# Patient Record
Sex: Male | Born: 1965 | Race: White | Hispanic: No | Marital: Married | Smoking: Never smoker
Health system: Southern US, Community
[De-identification: ages and names within clinical notes are randomized; demographics above are authoritative.]

## PROBLEM LIST (undated history)

## (undated) DIAGNOSIS — G4730 Sleep apnea, unspecified: Secondary | ICD-10-CM

## (undated) DIAGNOSIS — F419 Anxiety disorder, unspecified: Secondary | ICD-10-CM

## (undated) DIAGNOSIS — E782 Mixed hyperlipidemia: Secondary | ICD-10-CM

## (undated) DIAGNOSIS — Z860100 Personal history of colon polyps, unspecified: Secondary | ICD-10-CM

## (undated) DIAGNOSIS — J45909 Unspecified asthma, uncomplicated: Secondary | ICD-10-CM

## (undated) DIAGNOSIS — K219 Gastro-esophageal reflux disease without esophagitis: Secondary | ICD-10-CM

## (undated) DIAGNOSIS — E079 Disorder of thyroid, unspecified: Secondary | ICD-10-CM

## (undated) DIAGNOSIS — E7800 Pure hypercholesterolemia, unspecified: Secondary | ICD-10-CM

## (undated) DIAGNOSIS — K5792 Diverticulitis of intestine, part unspecified, without perforation or abscess without bleeding: Secondary | ICD-10-CM

## (undated) HISTORY — PX: COLECTOMY: SHX59

## (undated) HISTORY — DX: Anxiety disorder, unspecified: F41.9

## (undated) HISTORY — PX: KNEE SURGERY: SHX244

## (undated) HISTORY — DX: Personal history of colonic polyps: Z86.010

## (undated) HISTORY — DX: Mixed hyperlipidemia: E78.2

## (undated) HISTORY — DX: Unspecified asthma, uncomplicated: J45.909

## (undated) HISTORY — DX: Gastro-esophageal reflux disease without esophagitis: K21.9

## (undated) HISTORY — DX: Personal history of colon polyps, unspecified: Z86.0100

## (undated) HISTORY — DX: Sleep apnea, unspecified: G47.30

## (undated) HISTORY — DX: Disorder of thyroid, unspecified: E07.9

## (undated) HISTORY — DX: Diverticulitis of intestine, part unspecified, without perforation or abscess without bleeding: K57.92

---

## 2001-01-12 ENCOUNTER — Emergency Department (HOSPITAL_COMMUNITY): Admission: EM | Admit: 2001-01-12 | Discharge: 2001-01-12 | Payer: Self-pay | Admitting: Emergency Medicine

## 2004-09-03 ENCOUNTER — Encounter: Admission: RE | Admit: 2004-09-03 | Discharge: 2004-09-03 | Payer: Self-pay | Admitting: *Deleted

## 2004-09-15 ENCOUNTER — Encounter (INDEPENDENT_AMBULATORY_CARE_PROVIDER_SITE_OTHER): Payer: Self-pay | Admitting: Specialist

## 2004-09-15 ENCOUNTER — Ambulatory Visit (HOSPITAL_COMMUNITY): Admission: RE | Admit: 2004-09-15 | Discharge: 2004-09-15 | Payer: Self-pay | Admitting: *Deleted

## 2004-12-06 ENCOUNTER — Emergency Department (HOSPITAL_COMMUNITY): Admission: EM | Admit: 2004-12-06 | Discharge: 2004-12-06 | Payer: Self-pay | Admitting: Family Medicine

## 2006-10-19 ENCOUNTER — Encounter: Admission: RE | Admit: 2006-10-19 | Discharge: 2006-10-19 | Payer: Self-pay | Admitting: Gastroenterology

## 2007-02-08 ENCOUNTER — Inpatient Hospital Stay (HOSPITAL_COMMUNITY): Admission: AD | Admit: 2007-02-08 | Discharge: 2007-02-10 | Payer: Self-pay | Admitting: Gastroenterology

## 2007-02-08 ENCOUNTER — Encounter: Admission: RE | Admit: 2007-02-08 | Discharge: 2007-02-08 | Payer: Self-pay | Admitting: Gastroenterology

## 2007-02-27 ENCOUNTER — Encounter: Admission: RE | Admit: 2007-02-27 | Discharge: 2007-02-27 | Payer: Self-pay | Admitting: Gastroenterology

## 2007-04-04 ENCOUNTER — Encounter: Admission: RE | Admit: 2007-04-04 | Discharge: 2007-04-04 | Payer: Self-pay | Admitting: Surgery

## 2007-05-31 ENCOUNTER — Encounter (INDEPENDENT_AMBULATORY_CARE_PROVIDER_SITE_OTHER): Payer: Self-pay | Admitting: Surgery

## 2007-05-31 ENCOUNTER — Inpatient Hospital Stay (HOSPITAL_COMMUNITY): Admission: RE | Admit: 2007-05-31 | Discharge: 2007-06-04 | Payer: Self-pay | Admitting: Surgery

## 2010-05-31 ENCOUNTER — Encounter: Payer: Self-pay | Admitting: Gastroenterology

## 2010-09-22 NOTE — H&P (Signed)
NAME:  Elijah Small, Elijah Small          ACCOUNT NO.:  [id number]   MEDICAL RECORD NO.:  [id number]          PATIENT TYPE:  INP   LOCATION:  5707                         FACILITY:  [facility]   PHYSICIAN:  John C. Madilyn Fireman, M.D.    DATE OF BIRTH:  1965/12/23   DATE OF ADMISSION:  02/08/2007  DATE OF DISCHARGE:                              HISTORY & PHYSICAL   [name] a 45 year old male with a history of recent  diverticulitis in June 2008.  He presents today with recurrent symptoms  of suprapubic pain that spreads to his left lower quadrant.  The patient  had one episode of bright red blood per rectum last week but none since.  He has had no other changes in his bowel habits.  Denies any melena,  vomiting, nausea or anorexia as well as any recent fever or illness.  The patient reports that both his father and his brother have had  diverticulosis.  His brother had part of his colon removed.  The patient  does report having occasional indigestion for which he takes Prilosec.   PRIMARY CARE PHYSICIAN:  Dr. Wynelle Link with [facility].   PHYSICIANS:  Gastroenterologist is Dr. Wandalee Ferdinand.   PAST MEDICAL HISTORY:  1. Sleep apnea.  2. History of colon polyps.  3. History of vasectomy.  His last colonoscopy was done in May 2006.      Findings were rare diverticulum of sigmoid colon, possibly internal      hemorrhoids.  Biopsy was benign.  Dr. Virginia Rochester was the endoscopist.  He      notes to consider doing the patient under general anesthesia or      propofol with his next colonoscopy.  His last upper endoscopy was      done by Dr. Virginia Rochester on the same date May 2006.  Findings included      erythematous changes of the stomach, distal esophagus; both of      which were biopsied and were benign.  He was H. pylori negative,      and he had a widely patent gastroesophageal junction.   CURRENT MEDICATIONS:  1. Prilosec.  2. He reports no excessive NSAID use.   ALLERGIES:  He has no known drug  allergies.   FAMILY HISTORY:  Negative for colon cancer, liver disease.   SOCIAL HISTORY:  The patient is an Art gallery manager.  He is married.  He reports  no alcohol, tobacco or drug use.   PHYSICAL EXAMINATION:  GENERAL:  He is alert, oriented, in no apparent  distress.  VITAL SIGNS:  Current temperature is 100, pulse 103, blood pressure  124/78.  HEART:  Has a regular rate and rhythm with no murmurs, rubs or gallops  appreciated.  LUNGS:  Clear to auscultation bilaterally with no wheezes or crackles.  ABDOMEN:  Soft, tender in the left lower quadrant and suprapubic areas.  He has good bowel sounds.   LABORATORY DATA:  Labs are pending.  CT of his abdomen and pelvis done  today February 08, 2007 at [facility] showed acute sigmoid  diverticulitis with focal perforation.   ASSESSMENT:  Dr. Jonny Ruiz  Madilyn Fireman has seen and examined the patient, collected  a history, and reviewed the patient's record.  He states this is a 89-  year-old generally healthy male who has had a previous bout of  diverticulitis in June.  Presents today with focal perforation on CT  scan.  Will admit, gently rehydrate, give IV Cipro and Flagyl, and  monitor closely.  Will do colonoscopy at the appropriate time.      Stephani Police, PA    ______________________________  Everardo All Madilyn Fireman, M.D.    MLY/MEDQ  D:  02/08/2007  T:  02/09/2007  Job:  [id number]   cc:   Graylin Shiver, M.D.  Deatra James, M.D.

## 2010-09-22 NOTE — Op Note (Signed)
NAME:  Elijah Small, Elijah Small          ACCOUNT NO.:  [id number]   MEDICAL RECORD NO.:  [id number]          PATIENT TYPE:  INP   LOCATION:  0011                         FACILITY:  [facility]   PHYSICIAN:  Currie Paris, M.D.DATE OF BIRTH:  06/15/1965   DATE OF PROCEDURE:  05/31/2007  DATE OF DISCHARGE:                               OPERATIVE REPORT   PREOPERATIVE DIAGNOSIS:  Sigmoid diverticulitis, diverticulosis and  recent episodes of diverticulitis.   POSTOPERATIVE DIAGNOSIS:  Sigmoid diverticulosis with subacute sigmoid  diverticulitis.   OPERATION PERFORMED:  Laparoscopic assisted sigmoid colectomy.   SURGEON:  Cyndia Bent, M.D.   ASSISTANT:  Bertram Savin, M.D.   ANESTHESIA:  General endotracheal.   CLINICAL HISTORY:  Elijah Small is a 45 year old gentleman who has had  multiple episodes of diverticulitis, one of which at least had shown a  little localized microperforation in the sigmoid colon.  A very minimum  it has shown fairly extensive diverticulosis, but a lot of spasm in the  area that had the perforation was muscular thickening.  After a lengthy  discussion with the patient, he elected to proceed to sigmoid colectomy.   DESCRIPTION OF PROCEDURE:  The patient was seen in the holding area and  he had no further questions.  We confirmed sigmoid colectomy as the  planned procedure.   The patient was taken to the operating room and after satisfactory  general endotracheal anesthesia had been obtained, a Foley catheter was  placed and the abdomen clipped, prepped and draped.  The time-out was  done.   I made a short supraumbilical incision, opened the fascia, put in a  pursestring and introduced the Hasson cannula.  With the camera place, I  could see there was an area of inflammatory changes in the left lower  quadrant consistent with his prior diverticulitis.  Two 5 mm ports were  placed; one in the right lower quadrant and one in the suprapubic  midline, both  under direct vision.  With those in place, I was able to  mobilize a little bit of the colon and see that he had a chronic abscess  cavity from the sigmoid to almost the anterior abdominal wall well away  from the retroperitoneal structures and ureter.  Except for that, the  colon appeared quite mobile.   I put a third 5 mm port in the left upper quadrant and using a  combination of scissors, sharp and blunt dissection and the Ligasure  device, I was able to mobilize up the white line of Toldt to the splenic  flexure to make sure we had adequate mobilization of the descending  colon, but we really did not go up and take the splenic flexure down.   Attention was turned back and we really had a very floppy sigmoid and  the area of the perforation appeared to be in the mid sigmoid and once  it had been bluntly freed up from the abdominal wall was quite mobile.  I reflected that up a little bit, but thought that we could really do a  limited sigmoid resection.  He has known diverticular disease, but I  thought that it would be much more major resection to get all of the  small tics out and clearly his symptomatology was related to the small  perforation that had been apparently chronic.   Once I had all of this freed up, I went ahead and made a short  suprapubic incision and entered the peritoneal cavity.  The GelPort  protector was placed.  Initially I had planned to use my hand to dissect  this up further, but once this was in and we got visualization I saw  that the area of the perforation came right up into the wound with good  exposure distally to the rectosigmoid where there were no further tics  and good exposure proximally where we had already mobilized the colon,  so this all was into the wound nicely.   I thought that we could do a resection of this area and do a primary  anastomosis directly through the port.   I divided the distal colon right at the rectosigmoid junction and  then  divided the mesentery staying very close to the colon.  As noted, we  were well up onto the sigmoid so I was well away from the area of the  ureter.  I did this until I was in an area proximal that appeared to be  free of tics and any inflammatory process or any thickening.  The bowel  was divided there.  The specimens was handed off.  I did obtain a  culture of the area where the perforation was, so I think he had a small  chronic abscess cavity.   Stay sutures were placed on the corners of the bowel and the bowel  clamps removed.  An end-to-end sutured anastomosis single layer of 3-0  silk was performed producing a tension-free anastomosis that appeared to  be intact and quite viable with no evidence of any ischemia or other  issues.  As noted there was no tension.   I irrigated as we closed.  I palpated the anastomosis to be sure it was  widely patent.  The colon was dropped back in.  The GelPort was removed  and instruments and gloves changed.   The fascia was closed with a #1 looped PDS.  The abdomen was  reinsufflated through the supraumbilical port and using the camera made  sure there had been no evidence of bleeding or collection of blood or  any other problems that had occurred while we had the colon out and were  doing the resection.  Everything looked fine.  Those two ports were  removed.  The right lower quadrant port had also been removed.  The  abdomen was deflated.  Skin was closed with staples.   The patient tolerated the procedure well and there were no  complications.  Estimated blood loss was approximately 100 mL.  All  counts were correct.      Currie Paris, M.D.  Electronically Signed     CJS/MEDQ  D:  05/31/2007  T:  05/31/2007  Job:  [id number]   cc:   Deatra James, M.D.  Fax: [phone]   Graylin Shiver, M.D.  Fax: [phone]

## 2010-09-25 NOTE — Op Note (Signed)
NAME:  KEO, SCHIRMER          ACCOUNT NO.:  [id number]   MEDICAL RECORD NO.:  [id number]          PATIENT TYPE:  AMB   LOCATION:  ENDO                         FACILITY:  [facility]   PHYSICIAN:  Georgiana Spinner, M.D.    DATE OF BIRTH:  Oct 16, 1965   DATE OF PROCEDURE:  DATE OF DISCHARGE:                                 OPERATIVE REPORT   PROCEDURE:  Upper endoscopy with biopsy.   INDICATIONS:  Abdominal pain.   ANESTHESIA:  Demerol 75 mg, Versed 8 mg.   DESCRIPTION OF PROCEDURE:  With the patient mildly sedated in the left  lateral decubitus position, the Olympus videoscopic endoscope was inserted  in the mouth and passed under direct vision through the esophagus which  appeared mildly inflamed distally which was photographed and biopsied.  We  entered into the stomach and the fundus, body, antrum, duodenal bulb, and  second portion of the duodenum were visualized. From this point, the  endoscope was slowly withdrawn, taking circumferential views of the colonic  mucosa until the endoscope was then pulled back into the stomach and placed  in retroflexion, viewing the stomach from below and an incomplete wrap of  the GE junction was visualized and photographed.  The endoscope was then  straightened and withdrawn, taking circumferential views of the remaining  gastric and esophageal mucosa, stopping at the fundus where erythematous  changes were seen, photographed and biopsied.  The patient's vital signs and  pulse oximetry remained stable and the patient tolerated the procedure well  without apparent complications.   FINDINGS:  1.  Erythematous changes of stomach, biopsied.  2.  Erythematous changes of the distal esophagus, biopsied.  3.  Widely patent gastroesophageal junction, photographed.   PLAN:  Await biopsy report.  The patient will call me for results and follow  up with me as an outpatient and proceed to colonoscopy.      GMO/MEDQ  D:  09/15/2004  T:  09/15/2004  Job:   [id number]

## 2010-09-25 NOTE — Discharge Summary (Signed)
NAME:  Elijah Small, Elijah Small          ACCOUNT NO.:  [id number]   MEDICAL RECORD NO.:  [id number]          PATIENT TYPE:  INP   LOCATION:  1526                         [facility]ACILITY:  [facility]   PHYSICIAN:  Currie Paris, M.D.DATE OF BIRTH:  07-12-1965   DATE OF ADMISSION:  05/31/2007  DATE OF DISCHARGE:  06/04/2007                               DISCHARGE SUMMARY   FINAL DIAGNOSES:  1. 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  2. History of sleep apnea.   CLINICAL HISTORY:  [name] is a 45 year old gentleman who has  been treated on several occasions for somewhat recurrent persistent  diverticulitis.  He was admitted for elective sigmoid colectomy.   HOSPITAL COURSE:  The patient was taken to the operating room where on  January 21 and a laparoscopic-assisted sigmoid colectomy with primary  anastomosis was performed.  Operative finding was that of a chronic  small abscess cavity, fairly anteriorly where he had a tiny perforation  of the sigmoid which I think had been walled off but still responsible  for his intermittent episodes.   The patient tolerated the surgery well.  Postoperatively, he had a  fairly benign postoperative course, and by the fourth postop day was  tolerating diet, ambulating with minimal pain and able to be discharged.  He was discharged to be followed in my office in approximately 1 week.  Pathology report confirmed diverticulitis with rupture.   The patient was to be followed up in my office for suture removal in  approximately week to 10 days.  His activities are limited.  He will  also arrange long-term followup with his primary physicians.      Currie Paris, M.D.  Electronically Signed     CJS/MEDQ  D:  06/13/2007  T:  06/13/2007  Job:  [id number]   cc:   Deatra James, M.D.  Fax: [phone]   Graylin Shiver, M.D.  Fax: [phone]

## 2010-09-25 NOTE — Op Note (Signed)
NAME:  Elijah Small, Elijah Small          ACCOUNT NO.:  [id number]   MEDICAL RECORD NO.:  [id number]          PATIENT TYPE:  AMB   LOCATION:  ENDO                         FACILITY:  [facility]   PHYSICIAN:  Georgiana Spinner, M.D.    DATE OF BIRTH:  03-21-1966   DATE OF PROCEDURE:  DATE OF DISCHARGE:                                 OPERATIVE REPORT   PROCEDURE:  Colonoscopy.   INDICATIONS:  Colon polyp.   ANESTHESIA:  Demerol was not given, just Versed 2 mg.   PROCEDURE:  With the patient mildly sedated in the left lateral decubitus  position, a rectal examination was performed. Prostate was normal.  Subsequently, the Olympus videoscopic colonoscope was inserted in the rectum  and passed under direct vision to the cecum, identified by base of cecum,  ileocecal valve and appendiceal orifice both which were photographed.  From  this point, the colonoscope was slowly withdrawn taking circumferential  views of the colonic mucosa stopping at 40 cm from the anal verge at which  point we encountered a few diverticula and two polyps, one of which was  removed using hot biopsy forceps technique, the other with snare cautery  technique setting  with the same setting of 20/200 blended current, both  were retrieved for pathology. The endoscope was then withdrawn all the way  to the rectum which appeared normal on direct and retroflexed view.  The  endoscope was straightened and withdrawn. The patient's vital signs, pulse  oximeter remained stable. The patient tolerated procedure well without  apparent complications.   FINDINGS:  Rare diverticulum of sigmoid colon, possibly internal hemorrhoids  not well visualized,  polyps as described above.   PLAN:  Await biopsy report. The patient will call me for results. Follow-up  with me as an outpatient.  Consider doing him with general anesthesia or  Propofol on his next colonoscopy.      GMO/MEDQ  D:  09/15/2004  T:  09/15/2004  Job:  [id number]

## 2011-01-28 LAB — DIFFERENTIAL
Basophils Absolute: 0
Basophils Relative: 0
Neutrophils Relative %: 63

## 2011-01-28 LAB — COMPREHENSIVE METABOLIC PANEL
ALT: 57 — ABNORMAL HIGH
Albumin: 4
Alkaline Phosphatase: 45
CO2: 28
Calcium: 9.8
GFR calc Af Amer: >60
Total Bilirubin: 0.9
Total Protein: 6.9

## 2011-01-28 LAB — CBC
HCT: 44.7
Hemoglobin: 13.5
Hemoglobin: 15.7
MCHC: 35.3
MCV: 82.8
MCV: 83.9
Platelets: 160
Platelets: 168
RBC: 5.33
RDW: 12.4
WBC: 12.3 — ABNORMAL HIGH
WBC: 6.2

## 2011-01-28 LAB — URINALYSIS, ROUTINE W REFLEX MICROSCOPIC
Specific Gravity, Urine: 1.024
Urobilinogen, UA: 0.2

## 2011-01-28 LAB — WOUND CULTURE

## 2011-01-28 LAB — ANAEROBIC CULTURE: Gram Stain: NONE SEEN

## 2011-02-18 LAB — COMPREHENSIVE METABOLIC PANEL
AST: 27
AST: 28
Alkaline Phosphatase: 48
BUN: 7
CO2: 27
CO2: 29
Calcium: 9.2
Chloride: 105
Creatinine, Ser: 1.19
GFR calc Af Amer: >60
GFR calc Af Amer: >60
GFR calc non Af Amer: >60
Glucose, Bld: 115 — ABNORMAL HIGH
Potassium: 3.7
Sodium: 132 — ABNORMAL LOW
Total Protein: 6.5
Total Protein: 7.4

## 2011-02-18 LAB — BASIC METABOLIC PANEL
BUN: 7
Chloride: 103
Creatinine, Ser: 1.12
Glucose, Bld: 78
Potassium: 3.5

## 2011-02-18 LAB — HEPATIC FUNCTION PANEL
ALT: 66 — ABNORMAL HIGH
AST: 35
Bilirubin, Direct: 0.3
Indirect Bilirubin: 1.3 — ABNORMAL HIGH
Total Bilirubin: 1.6 — ABNORMAL HIGH

## 2011-02-18 LAB — CBC
HCT: 37.5 — ABNORMAL LOW
MCHC: 34.2
MCHC: 35.1
MCV: 84.8
RBC: 5.31
WBC: 10

## 2012-07-07 ENCOUNTER — Encounter: Payer: Self-pay | Admitting: Family Medicine

## 2012-07-07 DIAGNOSIS — K5792 Diverticulitis of intestine, part unspecified, without perforation or abscess without bleeding: Secondary | ICD-10-CM | POA: Insufficient documentation

## 2012-07-07 DIAGNOSIS — F419 Anxiety disorder, unspecified: Secondary | ICD-10-CM | POA: Insufficient documentation

## 2012-07-07 DIAGNOSIS — G4733 Obstructive sleep apnea (adult) (pediatric): Secondary | ICD-10-CM | POA: Insufficient documentation

## 2012-07-07 DIAGNOSIS — Z86010 Personal history of colonic polyps: Secondary | ICD-10-CM | POA: Insufficient documentation

## 2012-08-08 ENCOUNTER — Ambulatory Visit: Payer: Self-pay | Admitting: Family Medicine

## 2012-08-08 DIAGNOSIS — Z0289 Encounter for other administrative examinations: Secondary | ICD-10-CM

## 2012-11-30 ENCOUNTER — Telehealth: Payer: Self-pay | Admitting: *Deleted

## 2012-11-30 MED ORDER — SERTRALINE HCL 100 MG PO TABS: 100.0000 mg | ORAL_TABLET | Freq: Every day | ORAL | Status: DC

## 2012-11-30 NOTE — Telephone Encounter (Signed)
Please refill Zoloft for 30 days.  He has an up coming appt for labs and an office visit scheduled.  He uses [facility] on [address] in [place].

## 2012-11-30 NOTE — Addendum Note (Signed)
Addended by: Clint Bolder D on: 11/30/2012 03:44 PM   Modules accepted: Orders

## 2012-12-06 ENCOUNTER — Other Ambulatory Visit: Payer: Self-pay | Admitting: *Deleted

## 2012-12-06 DIAGNOSIS — R5381 Other malaise: Secondary | ICD-10-CM

## 2012-12-06 DIAGNOSIS — E785 Hyperlipidemia, unspecified: Secondary | ICD-10-CM

## 2012-12-06 DIAGNOSIS — E559 Vitamin D deficiency, unspecified: Secondary | ICD-10-CM

## 2012-12-06 DIAGNOSIS — R5383 Other fatigue: Secondary | ICD-10-CM

## 2012-12-07 ENCOUNTER — Other Ambulatory Visit: Payer: Self-pay

## 2012-12-07 LAB — COMPLETE METABOLIC PANEL WITH GFR
ALT: 26 U/L (ref 0–53)
AST: 17 U/L (ref 0–37)
Albumin: 4.4 g/dL (ref 3.5–5.2)
Alkaline Phosphatase: 49 U/L (ref 39–117)
BUN: 13 mg/dL (ref 6–23)
CO2: 26 mEq/L (ref 19–32)
Calcium: 9.2 mg/dL (ref 8.4–10.5)
Chloride: 105 mEq/L (ref 96–112)
Creat: 1.15 mg/dL (ref 0.50–1.35)
GFR, Est African American: 87 mL/min
GFR, Est Non African American: 75 mL/min
Glucose, Bld: 97 mg/dL (ref 70–99)
Potassium: 5 mEq/L (ref 3.5–5.3)
Sodium: 139 mEq/L (ref 135–145)
Total Bilirubin: 0.6 mg/dL (ref 0.3–1.2)
Total Protein: 6.8 g/dL (ref 6.0–8.3)

## 2012-12-07 LAB — CBC WITH DIFFERENTIAL/PLATELET
Basophils Absolute: 0 10*3/uL (ref 0.0–0.1)
Basophils Relative: 0 % (ref 0–1)
Eosinophils Absolute: 0.2 10*3/uL (ref 0.0–0.7)
Eosinophils Relative: 3 % (ref 0–5)
HCT: 40.7 % (ref 39.0–52.0)
Hemoglobin: 14.3 g/dL (ref 13.0–17.0)
Lymphocytes Relative: 31 % (ref 12–46)
Lymphs Abs: 1.8 10*3/uL (ref 0.7–4.0)
MCH: 29.1 pg (ref 26.0–34.0)
MCHC: 35.1 g/dL (ref 30.0–36.0)
MCV: 82.9 fL (ref 78.0–100.0)
Monocytes Absolute: 0.5 10*3/uL (ref 0.1–1.0)
Monocytes Relative: 9 % (ref 3–12)
Neutro Abs: 3.3 10*3/uL (ref 1.7–7.7)
Neutrophils Relative %: 57 % (ref 43–77)
Platelets: 132 10*3/uL — ABNORMAL LOW (ref 150–400)
RBC: 4.91 MIL/uL (ref 4.22–5.81)
RDW: 13.5 % (ref 11.5–15.5)
WBC: 5.8 10*3/uL (ref 4.0–10.5)

## 2012-12-07 LAB — LIPID PANEL
Cholesterol: 199 mg/dL (ref 0–200)
HDL: 33 mg/dL — ABNORMAL LOW (ref >39)
LDL Cholesterol: 144 mg/dL — ABNORMAL HIGH (ref 0–99)
Total CHOL/HDL Ratio: 6 Ratio
Triglycerides: 108 mg/dL (ref <150)
VLDL: 22 mg/dL (ref 0–40)

## 2012-12-07 LAB — TSH: TSH: 4.349 u[IU]/mL (ref 0.350–4.500)

## 2012-12-08 LAB — VITAMIN D 25 HYDROXY (VIT D DEFICIENCY, FRACTURES): Vit D, 25-Hydroxy: 38 ng/mL (ref 30–89)

## 2012-12-21 ENCOUNTER — Ambulatory Visit (INDEPENDENT_AMBULATORY_CARE_PROVIDER_SITE_OTHER): Payer: 59 | Admitting: Family Medicine

## 2012-12-21 ENCOUNTER — Encounter: Payer: Self-pay | Admitting: Family Medicine

## 2012-12-21 VITALS — BP 108/70 | HR 79 | Resp 16 | Ht 69.0 in | Wt 254.0 lb

## 2012-12-21 DIAGNOSIS — F411 Generalized anxiety disorder: Secondary | ICD-10-CM

## 2012-12-21 DIAGNOSIS — E785 Hyperlipidemia, unspecified: Secondary | ICD-10-CM

## 2012-12-21 MED ORDER — SERTRALINE HCL 100 MG PO TABS: 100.0000 mg | ORAL_TABLET | Freq: Every day | ORAL | Status: DC

## 2012-12-21 MED ORDER — PRAVASTATIN SODIUM 40 MG PO TABS: 40.0000 mg | ORAL_TABLET | Freq: Every day | ORAL | Status: DC

## 2012-12-21 NOTE — Patient Instructions (Addendum)
1)  Mood - Stay on the Zoloft.   2)  Cholesterol - Take pravastatin 40 mg per day.  We'll recheck your level in 6 months.     Fat and Cholesterol Control Diet Cholesterol levels in your body are determined significantly by your diet. Cholesterol levels may also be related to heart disease. The following material helps to explain this relationship and discusses what you can do to help keep your heart healthy. Not all cholesterol is bad. Low-density lipoprotein (LDL) cholesterol is the "bad" cholesterol. It may cause fatty deposits to build up inside your arteries. High-density lipoprotein (HDL) cholesterol is "good." It helps to remove the "bad" LDL cholesterol from your blood. Cholesterol is a very important risk factor for heart disease. Other risk factors are high blood pressure, smoking, stress, heredity, and weight. The heart muscle gets its supply of blood through the coronary arteries. If your LDL cholesterol is high and your HDL cholesterol is low, you are at risk for having fatty deposits build up in your coronary arteries. This leaves less room through which blood can flow. Without sufficient blood and oxygen, the heart muscle cannot function properly and you may feel chest pains (angina pectoris). When a coronary artery closes up entirely, a part of the heart muscle may die causing a heart attack (myocardial infarction). CHECKING CHOLESTEROL When your caregiver sends your blood to a lab to be examined for cholesterol, a complete lipid (fat) profile may be done. With this test, the total amount of cholesterol and levels of LDL and HDL are determined. Triglycerides are a type of fat that circulates in the blood. They can also be used to determine heart disease risk. The list below describes what the numbers should be: Test: Total Cholesterol.  Less than 200 mg/dl. Test: LDL "bad cholesterol."  Less than 100 mg/dl.  Less than 70 mg/dl if you are at very high risk of a heart attack or sudden  cardiac death. Test: HDL "good cholesterol."  Greater than 50 mg/dl for women.  Greater than 40 mg/dl for men. Test: Triglycerides.  Less than 150 mg/dl. CONTROLLING CHOLESTEROL WITH DIET Although exercise and lifestyle factors are important, your diet is key. That is because certain foods are known to raise cholesterol and others to lower it. The goal is to balance foods for their effect on cholesterol and more importantly, to replace saturated and trans fat with other types of fat, such as monounsaturated fat, polyunsaturated fat, and omega-3 fatty acids. On average, a person should consume no more than 15 to 17 g of saturated fat daily. Saturated and trans fats are considered "bad" fats, and they will raise LDL cholesterol. Saturated fats are primarily found in animal products such as meats, butter, and cream. However, that does not mean you need to give up all your favorite foods. Today, there are good tasting, low-fat, low-cholesterol substitutes for most of the things you like to eat. Choose low-fat or nonfat alternatives. Choose round or loin cuts of red meat. These types of cuts are lowest in fat and cholesterol. Chicken (without the skin), fish, veal, and ground [place] breast are great choices. Eliminate fatty meats, such as hot dogs and salami. Even shellfish have little or no saturated fat. Have a 3 oz (85 g) portion when you eat lean meat, poultry, or fish. Trans fats are also called "partially hydrogenated oils." They are oils that have been scientifically manipulated so that they are solid at room temperature resulting in a longer shelf life and  improved taste and texture of foods in which they are added. Trans fats are found in stick margarine, some tub margarines, cookies, crackers, and baked goods.  When baking and cooking, oils are a great substitute for butter. The monounsaturated oils are especially beneficial since it is believed they lower LDL and raise HDL. The oils you should  avoid entirely are saturated tropical oils, such as coconut and palm.  Remember to eat a lot from food groups that are naturally free of saturated and trans fat, including fish, fruit, vegetables, beans, grains (barley, rice, couscous, bulgur wheat), and pasta (without cream sauces).  IDENTIFYING FOODS THAT LOWER CHOLESTEROL  Soluble fiber may lower your cholesterol. This type of fiber is found in fruits such as apples, vegetables such as broccoli, potatoes, and carrots, legumes such as beans, peas, and lentils, and grains such as barley. Foods fortified with plant sterols (phytosterol) may also lower cholesterol. You should eat at least 2 g per day of these foods for a cholesterol lowering effect.  Read package labels to identify low-saturated fats, trans fat free, and low-fat foods at the supermarket. Select cheeses that have only 2 to 3 g saturated fat per ounce. Use a heart-healthy tub margarine that is free of trans fats or partially hydrogenated oil. When buying baked goods (cookies, crackers), avoid partially hydrogenated oils. Breads and muffins should be made from whole grains (whole-wheat or whole oat flour, instead of "flour" or "enriched flour"). Buy non-creamy canned soups with reduced salt and no added fats.  FOOD PREPARATION TECHNIQUES  Never deep-fry. If you must fry, either stir-fry, which uses very little fat, or use non-stick cooking sprays. When possible, broil, bake, or roast meats, and steam vegetables. Instead of putting butter or margarine on vegetables, use lemon and herbs, applesauce, and cinnamon (for squash and sweet potatoes), nonfat yogurt, salsa, and low-fat dressings for salads.  LOW-SATURATED FAT / LOW-FAT FOOD SUBSTITUTES Meats / Saturated Fat (g)  Avoid: Steak, marbled (3 oz/85 g) / 11 g  Choose: Steak, lean (3 oz/85 g) / 4 g  Avoid: Hamburger (3 oz/85 g) / 7 g  Choose: Hamburger, lean (3 oz/85 g) / 5 g  Avoid: Ham (3 oz/85 g) / 6 g  Choose: Ham, lean cut (3  oz/85 g) / 2.4 g  Avoid: Chicken, with skin, dark meat (3 oz/85 g) / 4 g  Choose: Chicken, skin removed, dark meat (3 oz/85 g) / 2 g  Avoid: Chicken, with skin, light meat (3 oz/85 g) / 2.5 g  Choose: Chicken, skin removed, light meat (3 oz/85 g) / 1 g Dairy / Saturated Fat (g)  Avoid: Whole milk (1 cup) / 5 g  Choose: Low-fat milk, 2% (1 cup) / 3 g  Choose: Low-fat milk, 1% (1 cup) / 1.5 g  Choose: Skim milk (1 cup) / 0.3 g  Avoid: Hard cheese (1 oz/28 g) / 6 g  Choose: Skim milk cheese (1 oz/28 g) / 2 to 3 g  Avoid: Cottage cheese, 4% fat (1 cup) / 6.5 g  Choose: Low-fat cottage cheese, 1% fat (1 cup) / 1.5 g  Avoid: Ice cream (1 cup) / 9 g  Choose: Sherbet (1 cup) / 2.5 g  Choose: Nonfat frozen yogurt (1 cup) / 0.3 g  Choose: Frozen fruit bar / trace  Avoid: Whipped cream (1 tbs) / 3.5 g  Choose: Nondairy whipped topping (1 tbs) / 1 g Condiments / Saturated Fat (g)  Avoid: Mayonnaise (1 tbs) / 2 g  Choose: Low-fat mayonnaise (1 tbs) / 1 g  Avoid: Butter (1 tbs) / 7 g  Choose: Extra light margarine (1 tbs) / 1 g  Avoid: Coconut oil (1 tbs) / 11.8 g  Choose: Olive oil (1 tbs) / 1.8 g  Choose: Corn oil (1 tbs) / 1.7 g  Choose: Safflower oil (1 tbs) / 1.2 g  Choose: Sunflower oil (1 tbs) / 1.4 g  Choose: Soybean oil (1 tbs) / 2.4 g  Choose: Canola oil (1 tbs) / 1 g Document Released: 04/26/2005 Document Revised: 07/19/2011 Document Reviewed: 10/15/2010 [employer] Patient Information 2014 [place], [place].

## 2012-12-21 NOTE — Progress Notes (Signed)
  Subjective:    Patient ID: Elijah Small, male    DOB: 1965/10/02, 47 y.o.   MRN: [id number]  HPI  Herb is here today to go over his most recent lab results and to discuss the conditions listed below:   1)  Mood:  He is doing well with his Zoloft and needs a refill on it.   2)  Hyperlipidemia:  He admits to not being very consistent with taking his pravastatin daily.       Review of Systems  Constitutional: Negative.   HENT: Negative.   Eyes: Negative.   Respiratory: Negative.   Cardiovascular: Negative.   Gastrointestinal: Negative.   Endocrine: Negative.   Musculoskeletal: Negative.   Skin: Negative.   Allergic/Immunologic: Negative.   Neurological: Negative.   Hematological: Negative.   Psychiatric/Behavioral: Negative.     Past Medical History  Diagnosis Date  . GERD (gastroesophageal reflux disease)   . Anxiety   . Sleep apnea   . History of colon polyps   . Diverticulitis     Family History  Problem Relation Age of Onset  . Hypertension Father   . Heart disease Father     He died at 47 from MI     History   Social History Narrative   Marital Status: Married Rosalita Chessman)   Children: Son (1), Daughters (2)    Pets: Dogs (4)     Living Situation: Lives with wife and kids.   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 He appears well-nourished. No distress.  HENT:  Head: Normocephalic.  Eyes: No scleral icterus.  Neck: Neck supple. No thyromegaly present.  Cardiovascular: Normal rate, regular rhythm and normal heart sounds.   Pulmonary/Chest: Effort normal and breath sounds normal.  Musculoskeletal: Normal range of motion. He exhibits no edema.  Neurological:  He is alert and oriented to person, place, and time.  Skin: Skin is warm and dry. No rash noted.  Psychiatric: He has a normal mood and affect. His behavior is normal. Judgment and thought content normal.          Assessment & Plan:

## 2013-02-04 DIAGNOSIS — F419 Anxiety disorder, unspecified: Secondary | ICD-10-CM | POA: Insufficient documentation

## 2013-02-04 DIAGNOSIS — F411 Generalized anxiety disorder: Secondary | ICD-10-CM | POA: Insufficient documentation

## 2013-02-04 DIAGNOSIS — E7849 Other hyperlipidemia: Secondary | ICD-10-CM | POA: Insufficient documentation

## 2013-02-04 DIAGNOSIS — E782 Mixed hyperlipidemia: Secondary | ICD-10-CM | POA: Insufficient documentation

## 2013-02-04 NOTE — Assessment & Plan Note (Signed)
His LDL remains elevated which is not surprising since he has not been consistent with taking his pravastatin.  He is going to work harder on taking it daily and we'll recheck his lipid panel in 6 months.

## 2013-02-04 NOTE — Assessment & Plan Note (Signed)
Refilled his Zoloft.

## 2013-06-11 ENCOUNTER — Other Ambulatory Visit: Payer: Self-pay | Admitting: *Deleted

## 2013-06-11 DIAGNOSIS — E785 Hyperlipidemia, unspecified: Secondary | ICD-10-CM

## 2013-06-12 ENCOUNTER — Other Ambulatory Visit: Payer: 59

## 2013-06-12 LAB — COMPLETE METABOLIC PANEL WITH GFR
ALT: 29 U/L (ref 0–53)
AST: 17 U/L (ref 0–37)
Albumin: 4.2 g/dL (ref 3.5–5.2)
Alkaline Phosphatase: 51 U/L (ref 39–117)
BUN: 12 mg/dL (ref 6–23)
CO2: 28 mEq/L (ref 19–32)
Calcium: 9.3 mg/dL (ref 8.4–10.5)
Chloride: 105 mEq/L (ref 96–112)
Creat: 0.98 mg/dL (ref 0.50–1.35)
GFR, Est African American: >89 mL/min
GFR, Est Non African American: >89 mL/min
Glucose, Bld: 103 mg/dL — ABNORMAL HIGH (ref 70–99)
Potassium: 5.3 mEq/L (ref 3.5–5.3)
Sodium: 142 mEq/L (ref 135–145)
Total Bilirubin: 0.5 mg/dL (ref 0.2–1.2)
Total Protein: 6.7 g/dL (ref 6.0–8.3)

## 2013-06-12 LAB — LIPID PANEL
Cholesterol: 147 mg/dL (ref 0–200)
HDL: 33 mg/dL — ABNORMAL LOW (ref >39)
LDL Cholesterol: 89 mg/dL (ref 0–99)
Total CHOL/HDL Ratio: 4.5 Ratio
Triglycerides: 123 mg/dL (ref <150)
VLDL: 25 mg/dL (ref 0–40)

## 2013-06-15 ENCOUNTER — Ambulatory Visit: Payer: 59 | Admitting: Family Medicine

## 2013-06-20 ENCOUNTER — Ambulatory Visit: Payer: 59 | Admitting: Family Medicine

## 2013-06-28 ENCOUNTER — Ambulatory Visit: Payer: 59 | Admitting: Family Medicine

## 2013-07-17 ENCOUNTER — Ambulatory Visit: Payer: 59 | Admitting: Family Medicine

## 2013-08-06 ENCOUNTER — Encounter: Payer: Self-pay | Admitting: Family Medicine

## 2013-08-06 ENCOUNTER — Ambulatory Visit (INDEPENDENT_AMBULATORY_CARE_PROVIDER_SITE_OTHER): Payer: 59 | Admitting: Family Medicine

## 2013-08-06 VITALS — BP 119/75 | HR 69 | Resp 16 | Wt 265.0 lb

## 2013-08-06 DIAGNOSIS — F411 Generalized anxiety disorder: Secondary | ICD-10-CM

## 2013-08-06 DIAGNOSIS — R7301 Impaired fasting glucose: Secondary | ICD-10-CM

## 2013-08-06 DIAGNOSIS — Z23 Encounter for immunization: Secondary | ICD-10-CM

## 2013-08-06 DIAGNOSIS — E785 Hyperlipidemia, unspecified: Secondary | ICD-10-CM

## 2013-08-06 LAB — POCT GLYCOSYLATED HEMOGLOBIN (HGB A1C): Hemoglobin A1C: 5.5

## 2013-08-06 MED ORDER — SERTRALINE HCL 100 MG PO TABS: 100.0000 mg | ORAL_TABLET | Freq: Every day | ORAL | Status: DC

## 2013-08-06 NOTE — Progress Notes (Signed)
Subjective:    Patient ID: Elijah Small, male    DOB: 1966/02/02, 48 y.o.   MRN: [id number]  HPI  Elijah Small is here today to go over his most recent lab results and to discuss the conditions listed below.  He has done well since his last office visit.   1)  Mood - He feels that his mood is stable on Zoloft and would like to continue on it.    2)  Cholesterol - He is doing well on pravastatin 40 mg.  He has been taking it more consistently since his last visit.     Review of Systems  Constitutional: Negative for fatigue and unexpected weight change.  HENT: Negative.   Respiratory: Negative for shortness of breath.   Cardiovascular: Negative for chest pain, palpitations and leg swelling.  Gastrointestinal: Negative.   Genitourinary: Negative.   Musculoskeletal: Negative for myalgias.  Skin: Negative.   Neurological: Negative.   Psychiatric/Behavioral: Negative.      Past Medical History  Diagnosis Date  . GERD (gastroesophageal reflux disease)   . Anxiety   . Sleep apnea   . History of colon polyps   . Diverticulitis      Past Surgical History  Procedure Laterality Date  . Colectomy      Dr Margot Chimes - 12 inches     History   Social History Narrative   Marital Status: Married Vinnie Level)   Children: Son (1), Daughters (2)    Pets: Dogs (4)     Living Situation: Lives with wife and kids.   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 Hypertension Father   . Heart disease Father     He died at 18 from MI      Current Outpatient Prescriptions on File Prior to Visit  Medication Sig Dispense Refill  . pravastatin (PRAVACHOL) 40 MG tablet Take 1 tablet (40 mg total) by mouth daily.  90 tablet  3   No current  facility-administered medications on file prior to visit.     No Known Allergies   Immunization History  Administered Date(s) Administered  . Tdap 08/06/2013       Objective:   Physical Exam  Nursing note and vitals reviewed. Constitutional: He is oriented to person, place, and time. He appears well-nourished. No distress.  HENT:  Head: Normocephalic.  Eyes: No scleral icterus.  Neck: Neck supple. No thyromegaly present.  Cardiovascular: Normal rate, regular rhythm and normal heart sounds.  Exam reveals no gallop and no friction rub.   No murmur heard. Pulmonary/Chest: Breath sounds normal. No respiratory distress. He exhibits no tenderness.  Musculoskeletal: He exhibits no edema.  Neurological: He is alert and oriented to person, place, and time.  Skin: Skin is warm and dry. No rash noted.  Psychiatric: He has a normal mood and affect. His behavior is normal. Judgment and thought content normal.      Assessment & Plan:    Elijah Small was seen today for medication management.  Diagnoses and associated orders for this visit:  Anxiety state, unspecified - sertraline (ZOLOFT) 100 MG tablet; Take 1 tablet (100 mg total) by mouth  daily.  IFG (impaired fasting glucose) Comments: His FBS was a little elevated at 103.  We checked an A1c which is WNL at 5.5%.   - POCT glycosylated hemoglobin (Hb A1C)  Other and unspecified hyperlipidemia Comments: His lipid panel is perfect on pravastatin 40 mg.    Need for prophylactic vaccination with combined diphtheria-tetanus-pertussis (DTP) vaccine - Tdap vaccine greater than or equal to 7yo IM   TIME SPENT "FACE TO FACE" WITH PATIENT -  30 MINS

## 2013-08-06 NOTE — Assessment & Plan Note (Signed)
He received his Boostrix without difficulty.  He was given a handout discussing possible side effects.

## 2013-08-06 NOTE — Patient Instructions (Signed)
1)  Cholesterol - Your pravastatin gets your LDL (bad cholesterol) to a perfect number when you take it everyday!! :)  To increase your HDL (good cholesterol) you will need to increase your intake of "good" fats and EXERCISE.   2)   Mood - Stay on the Zoloft.    HDL Cholesterol The test for high-density lipoprotein (HDL) cholesterol is used along with other lipid tests to determine your risk of developing heart disease. HDL is one of the classes of lipoproteins that carry cholesterol in the blood. H-density lipoprotein cholesterol is considered to be beneficial because it removes excess cholesterol and disposes of it. Hence HDL cholesterol is often termed "good" cholesterol. The test for HDL measures the amount of HDL cholesterol in blood. High levels of HDL cholesterol are good. PREPARATION FOR TEST A blood sample is drawn from a vein in the arm or from a finger. Fasting is not required unless the HDL cholesterlol level test is ordered with other tests, including total cholesterol, low-density lipoprotein cholesterol, and triglyceride level tests as part of a lipid profile. A complete lipid profile requires fasting for at least 12 hours before the test. If the testing occurs when a person is not fasting, only the HDL and total cholesterol levels may be used for risk assessment. NORMAL FINDINGS Male: greater than 45 mg/dL or greater than 0.75 mmol/L  Male: greater than 55 mg/dL or greater than 0.91 mmol/L  Ranges for normal findings may vary among different laboratories and hospitals. You should always check with your doctor after having lab work or other tests done to discuss the meaning of your test results and whether your values are considered within normal limits. MEANING OF TEST  Your caregiver will go over the test results with you and discuss the importance and meaning of your results, as well as treatment options and the need for additional tests. OBTAINING THE TEST RESULTS It is your  responsibility to obtain your test results. Ask the lab or department performing the test when and how you will get your results. Document Released: 05/20/2004 Document Revised: 12/27/2012 Document Reviewed: 04/06/2008 [facility] Patient Information 2014 [place], [place].

## 2013-12-05 ENCOUNTER — Other Ambulatory Visit: Payer: Self-pay | Admitting: Family Medicine

## 2014-01-30 ENCOUNTER — Ambulatory Visit: Payer: 59 | Admitting: Family Medicine

## 2014-03-03 DIAGNOSIS — K219 Gastro-esophageal reflux disease without esophagitis: Secondary | ICD-10-CM | POA: Insufficient documentation

## 2014-06-07 LAB — HM COLONOSCOPY

## 2015-12-26 ENCOUNTER — Ambulatory Visit (HOSPITAL_COMMUNITY)
Admission: EM | Admit: 2015-12-26 | Discharge: 2015-12-26 | Disposition: A | Discharge status: Home / Self Care | Payer: Commercial Managed Care - HMO | Attending: Internal Medicine | Admitting: Internal Medicine

## 2015-12-26 ENCOUNTER — Encounter (HOSPITAL_COMMUNITY): Payer: Self-pay | Admitting: *Deleted

## 2015-12-26 ENCOUNTER — Ambulatory Visit (INDEPENDENT_AMBULATORY_CARE_PROVIDER_SITE_OTHER): Payer: Commercial Managed Care - HMO

## 2015-12-26 DIAGNOSIS — M25562 Pain in left knee: Secondary | ICD-10-CM

## 2015-12-26 MED ORDER — KETOROLAC TROMETHAMINE 30 MG/ML IJ SOLN
30.0000 mg | Freq: Once | INTRAMUSCULAR | Status: AC
  Administered 2015-12-26: 30 mg via INTRAMUSCULAR

## 2015-12-26 MED ORDER — MELOXICAM 15 MG PO TABS: 15.0000 mg | ORAL_TABLET | Freq: Every day | ORAL | 0 refills | Status: DC

## 2015-12-26 MED ORDER — KETOROLAC TROMETHAMINE 30 MG/ML IJ SOLN
INTRAMUSCULAR | Status: AC
  Filled 2015-12-26: qty 1

## 2015-12-26 NOTE — ED Provider Notes (Signed)
CSN: [id number]     Arrival date & time 12/26/15  1554 History   First MD Initiated Contact with Patient 12/26/15 1603     Chief Complaint  Patient presents with  . Knee Pain   (Consider location/radiation/quality/duration/timing/severity/associated sxs/prior Treatment) HPI Patient reports a 1 month history of generalized Left knee pain.  He reports that it has gradually worsened over the last month and that today when he was going to push off his left foot to get onto his boat, he felt a pop followed by immediate pain.  He is now unable to bear weight on his left leg.  He denies swelling, erythema or bruising.  No history of injury to knee.  No falls.  No numbness/ tingling/ buckling of leg.  He has been using Motrin and Aleve with minimal relief of pain.  Has not applied heat/ ice or been using any bracing.  No h/o DM.  No use of anticoagulants. NKDA.  Past Medical History:  Diagnosis Date  . Anxiety   . Diverticulitis   . GERD (gastroesophageal reflux disease)   . History of colon polyps   . Sleep apnea    Past Surgical History:  Procedure Laterality Date  . COLECTOMY     Dr Margot Chimes - 12 inches   Family History  Problem Relation Age of Onset  . Hypertension Father   . Heart disease Father     He died at 59 from MI    Social History  Substance Use Topics  . Smoking status: Never Smoker  . Smokeless tobacco: Never Used  . Alcohol use No    Review of Systems  Musculoskeletal: Positive for arthralgias (left knee) and gait problem (cannot bear weight on left knee). Negative for joint swelling.  Skin: Negative for color change.  Neurological: Negative for weakness and numbness.    Allergies  Review of patient's allergies indicates no known allergies.  Home Medications   Prior to Admission medications   Medication Sig Start Date End Date Taking? Authorizing Provider  meloxicam (MOBIC) 15 MG tablet Take 1 tablet (15 mg total) by mouth daily. 12/26/15   Rafael Quesada Windell Moulding,  DO  pravastatin (PRAVACHOL) 40 MG tablet Take 1 tablet (40 mg total) by mouth daily. 12/21/12 12/21/13  Jonathon Resides, MD  sertraline (ZOLOFT) 100 MG tablet Take 1 tablet (100 mg total) by mouth daily. 08/06/13 08/06/14  Jonathon Resides, MD   Meds Ordered and Administered this Visit   Medications  ketorolac (TORADOL) 30 MG/ML injection 30 mg (not administered)    BP 100/70 (BP Location: Right Arm)   Pulse 78   Temp 98.6 F (37 C) (Oral)   Resp 18   SpO2 99%  No data found.  Physical Exam  Constitutional: He appears well-developed and well-nourished. No distress.  Patient brought in via wheelchair.  Unable to assess gait, as unable to bear weight on LLE.  HENT:  Head: Normocephalic and atraumatic.  Eyes: Conjunctivae and EOM are normal.  Neck: Normal range of motion.  Cardiovascular: Normal rate.   Pulmonary/Chest: Effort normal.  Musculoskeletal: He exhibits tenderness (to the posterior aspect of the knee. no joint line TTP, no patellar TTP, no tibial TTP). He exhibits no deformity.       Left knee: He exhibits decreased range of motion, swelling (mild swelling of medial aspect of knee) and abnormal meniscus (McMurray's +medial meniscus). He exhibits no effusion, no ecchymosis, no deformity, no laceration, no erythema, no LCL laxity, no bony tenderness and  no MCL laxity. No tenderness found. No medial joint line, no lateral joint line, no MCL, no LCL and no patellar tendon tenderness noted.  Right knee: palpable crepitus, has full AROM.  No effusion or deformities. Left knee: Mild atrophy of the quad appreciated on the left. Loss of about 10 degrees in extension of left knee.  Loss of about 20 degrees of flexion of left knee. +fullness but no discrete baker's cyst palpable on Left.  Negative Lachman's.  Negative Posterior drawer.  Skin: He is not diaphoretic.  Nursing note and vitals reviewed.   Urgent Care Course   Clinical Course   1635: High suspicion for meniscal involvement.   Likely will need MRI.  Will obtain baseline plain films to assess left knee.  Procedures (including critical care time)  Labs Review Labs Reviewed - No data to display  Imaging Review Dg Knee Complete 4 Views Left  Result Date: 12/26/2015 CLINICAL DATA:  Felt a pop when stepping off a trailer today, LEFT knee pain inside, unable to bear weight, initial encounter EXAM: LEFT KNEE - COMPLETE 4+ VIEW COMPARISON:  None FINDINGS: Bone mineralization normal. Joint spaces preserved. Single tiny spur at cranial margin of the patellar articular surface. No fracture, dislocation, or bone destruction. No joint effusion. IMPRESSION: No acute osseous abnormalities. Electronically Signed   By: Lavonia Dana M.D.   On: 12/26/2015 16:57     MDM   1. Left knee pain    BOSS KUBIK is a 50 y.o. male that presents for Left knee pain.  His exam is remarkable for decreased ROM in flexion and extension of knee.  No ligamentous findings.  However, positive McMurray's for Medial meniscus.  Suspect meniscal injury.   Plain films obtained of the knee which revealed no acute processes.  Suspect patient will need MRI.  Toradol injection administered.  Patient discharged with Mobic 15mg  qd x7 day supply.  Knee sleeve and crutches supplied.  Recommended that he seek orthopedic evaluation.  Urgent care hours of nearby orthopedic offices provided.  Recommended RICE therapy until evaluated.  Return precautions reviewed.  Meds ordered this encounter  Medications  . ketorolac (TORADOL) 30 MG/ML injection 30 mg  . meloxicam (MOBIC) 15 MG tablet    Sig: Take 1 tablet (15 mg total) by mouth daily.    Dispense:  7 tablet    Refill:  0   This patient was discussed with Noland Fordyce, PA, who agrees with my assessment and plan.   Justeen Hehr M. Lajuana Ripple, DO PGY-3, [place], DO 12/26/15 [phone]

## 2015-12-26 NOTE — ED Triage Notes (Signed)
Pt  Reports  He  Has    Had pain    In the  l  Knee    That  Has  Been causing  Him  Pain  For the  Last  Month    Today  He  Was  Gettting  Onto  His  Boat today         And  Felt a  Pop  He  Reports  Pain on  Weight  Bearing

## 2015-12-26 NOTE — ED Notes (Signed)
Pt  Declines   Crutches  As  He  Has  His  Own  pair

## 2015-12-26 NOTE — Discharge Instructions (Signed)
I think you have a meniscal injury.  I have prescribed a short course of anti-inflammatory/ pain medication to start TOMORROW (we gave you something similar via injection today).  Make sure that you eat with the medication.  Please follow up with orthopedics.  They may have to obtain an MRI of your knee.  Your xray did not show any acute fracture or large effusion.

## 2016-06-10 ENCOUNTER — Encounter: Payer: Self-pay | Admitting: Adult Health

## 2016-06-10 ENCOUNTER — Ambulatory Visit (INDEPENDENT_AMBULATORY_CARE_PROVIDER_SITE_OTHER): Payer: Managed Care, Other (non HMO) | Admitting: Adult Health

## 2016-06-10 VITALS — BP 110/75 | HR 76 | Ht 69.0 in | Wt 265.1 lb

## 2016-06-10 DIAGNOSIS — E7849 Other hyperlipidemia: Secondary | ICD-10-CM

## 2016-06-10 DIAGNOSIS — R5383 Other fatigue: Secondary | ICD-10-CM | POA: Insufficient documentation

## 2016-06-10 DIAGNOSIS — Z833 Family history of diabetes mellitus: Secondary | ICD-10-CM | POA: Diagnosis not present

## 2016-06-10 DIAGNOSIS — E669 Obesity, unspecified: Secondary | ICD-10-CM

## 2016-06-10 DIAGNOSIS — E039 Hypothyroidism, unspecified: Secondary | ICD-10-CM

## 2016-06-10 DIAGNOSIS — Z23 Encounter for immunization: Secondary | ICD-10-CM | POA: Diagnosis not present

## 2016-06-10 DIAGNOSIS — E784 Other hyperlipidemia: Secondary | ICD-10-CM | POA: Diagnosis not present

## 2016-06-10 NOTE — Patient Instructions (Addendum)
Exercising to Lose Weight Introduction Exercising can help you to lose weight. In order to lose weight through exercise, you need to do vigorous-intensity exercise. You can tell that you are exercising with vigorous intensity if you are breathing very hard and fast and cannot hold a conversation while exercising. Moderate-intensity exercise helps to maintain your current weight. You can tell that you are exercising at a moderate level if you have a higher heart rate and faster breathing, but you are still able to hold a conversation. How often should I exercise? Choose an activity that you enjoy and set realistic goals. Your health care provider can help you to make an activity plan that works for you. Exercise regularly as directed by your health care provider. This may include:  Doing resistance training twice each week, such as:  Push-ups.  Sit-ups.  Lifting weights.  Using resistance bands.  Doing a given intensity of exercise for a given amount of time. Choose from these options:  150 minutes of moderate-intensity exercise every week.  75 minutes of vigorous-intensity exercise every week.  A mix of moderate-intensity and vigorous-intensity exercise every week. Children, pregnant women, people who are out of shape, people who are overweight, and older adults may need to consult a health care provider for individual recommendations. If you have any sort of medical condition, be sure to consult your health care provider before starting a new exercise program. What are some activities that can help me to lose weight?  Walking at a rate of at least 4.5 miles an hour.  Jogging or running at a rate of 5 miles per hour.  Biking at a rate of at least 10 miles per hour.  Lap swimming.  Roller-skating or in-line skating.  Cross-country skiing.  Vigorous competitive sports, such as football, basketball, and soccer.  Jumping rope.  Aerobic dancing. How can I be more active in my  day-to-day activities?  Use the stairs instead of the elevator.  Take a walk during your lunch break.  If you drive, park your car farther away from work or school.  If you take public transportation, get off one stop early and walk the rest of the way.  Make all of your phone calls while standing up and walking around.  Get up, stretch, and walk around every 30 minutes throughout the day. What guidelines should I follow while exercising?  Do not exercise so much that you hurt yourself, feel dizzy, or get very short of breath.  Consult your health care provider prior to starting a new exercise program.  Wear comfortable clothes and shoes with good support.  Drink plenty of water while you exercise to prevent dehydration or heat stroke. Body water is lost during exercise and must be replaced.  Work out until you breathe faster and your heart beats faster. This information is not intended to replace advice given to you by your health care provider. Make sure you discuss any questions you have with your health care provider. Document Released: 05/29/2010 Document Revised: 10/02/2015 Document Reviewed: 09/27/2013  2017 Elsevier Cholesterol Cholesterol is a fat. Your body needs a small amount of cholesterol. Cholesterol (plaque) may build up in your blood vessels (arteries). That makes you more likely to have a heart attack or stroke. You cannot feel your cholesterol level. Having a blood test is the only way to find out if your level is high. Keep your test results. Work with your doctor to keep your cholesterol at a good level. What do  the results mean?  Total cholesterol is how much cholesterol is in your blood.  LDL is bad cholesterol. This is the type that can build up. Try to have low LDL.  HDL is good cholesterol. It cleans your blood vessels and carries LDL away. Try to have high HDL.  Triglycerides are fat that the body can store or burn for energy. What are good levels of  cholesterol?  Total cholesterol below 200.  LDL below 100 is good for people who have health risks. LDL below 70 is good for people who have very high risks.  HDL above 40 is good. It is best to have HDL of 60 or higher.  Triglycerides below 150. How can I lower my cholesterol? Diet  Follow your diet program as told by your doctor.  Choose fish, white meat chicken, or [place] that is roasted or baked. Try not to eat red meat, fried foods, sausage, or lunch meats.  Eat lots of fresh fruits and vegetables.  Choose whole grains, beans, pasta, potatoes, and cereals.  Choose olive oil, corn oil, or canola oil. Only use small amounts.  Try not to eat butter, mayonnaise, shortening, or palm kernel oils.  Try not to eat foods with trans fats.  Choose low-fat or nonfat dairy foods.  Drink skim or nonfat milk.  Eat low-fat or nonfat yogurt and cheeses.  Try not to drink whole milk or cream.  Try not to eat ice cream, egg yolks, or full-fat cheeses.  Healthy desserts include angel food cake, ginger snaps, animal crackers, hard candy, popsicles, and low-fat or nonfat frozen yogurt. Try not to eat pastries, cakes, pies, and cookies. Exercise  Follow your exercise program as told by your doctor.  Be more active. Try gardening, walking, and taking the stairs.  Ask your doctor about ways that you can be more active. Medicine  Take over-the-counter and prescription medicines only as told by your doctor. This information is not intended to replace advice given to you by your health care provider. Make sure you discuss any questions you have with your health care provider. Document Released: 07/23/2008 Document Revised: 11/26/2015 Document Reviewed: 11/06/2015 Elsevier Interactive Patient Education  2017 [place] with medication as directed.  Increase regular exercise and reduce saturated fat/sugar intake. Will call when lab data results. Return for annual follow-up,  or sooner if needed.

## 2016-06-10 NOTE — Assessment & Plan Note (Signed)
Increase daily water intake.  Increase regular exercise and improve diet.

## 2016-06-10 NOTE — Assessment & Plan Note (Signed)
Information provided on regular exercise habits and healthy eating.

## 2016-06-10 NOTE — Assessment & Plan Note (Signed)
Influenza vaccination given in clinic today.

## 2016-06-10 NOTE — Assessment & Plan Note (Signed)
Continue Levothyroxine as directed.  TSH level drawn today, will make medication adjustment if warranted.

## 2016-06-10 NOTE — Assessment & Plan Note (Signed)
Reduce sugar/CHO intake.  Increase regular exercise.

## 2016-06-10 NOTE — Assessment & Plan Note (Signed)
Continue Rosuvastatin 40mg  daily.  Reduce saturated fat intake.  Increase regular exercise.

## 2016-06-10 NOTE — Progress Notes (Signed)
Subjective:    Patient ID: Elijah Small, male    DOB: [birth year], 51 y.o.   MRN: [id number]  HPI:  [name] present to establish as a new pt.  He feels well generally with the exception of fatigue and 25+lb weight gain in the last two months.     Patient Care Team    Relationship Specialty Notifications Start End  Jonathon Resides, MD PCP - General Family Medicine All results, Admissions 11/30/12     Patient Active Problem List   Diagnosis Date Noted  . Other fatigue 06/10/2016  . Family history of diabetes mellitus 06/10/2016  . Acquired hypothyroidism 06/10/2016  . Need for influenza vaccination 06/10/2016  . Need for prophylactic vaccination with combined diphtheria-tetanus-pertussis (DTP) vaccine 08/06/2013  . Anxiety state, unspecified 02/04/2013  . Other hyperlipidemia 02/04/2013  . GERD (gastroesophageal reflux disease)   . Diverticulitis   . Sleep apnea   . History of colon polyps      Past Medical History:  Diagnosis Date  . Anxiety   . Diverticulitis   . GERD (gastroesophageal reflux disease)   . History of colon polyps   . Sleep apnea   . Thyroid disease      Past Surgical History:  Procedure Laterality Date  . COLECTOMY     Dr Margot Chimes - 12 inches  . KNEE SURGERY Left      Family History  Problem Relation Age of Onset  . Hypertension Father   . Heart disease Father     He died at 57 from MI   . Stroke Mother   . Dementia Mother   . Cancer Brother     thyroid  . Diabetes Brother   . Hypertension Brother      History  Drug Use No     History  Alcohol Use No     History  Smoking Status  . Never Smoker  Smokeless Tobacco  . Current User  . Types: Snuff     Outpatient Encounter Prescriptions as of 06/10/2016  Medication Sig  . levothyroxine (SYNTHROID, LEVOTHROID) 50 MCG tablet Take 1 tablet by mouth daily.  . rosuvastatin (CRESTOR) 40 MG tablet Take 1 tablet by mouth daily.  . sertraline (ZOLOFT) 100 MG tablet Take  50 mg by mouth daily.  . [DISCONTINUED] meloxicam (MOBIC) 15 MG tablet Take 1 tablet (15 mg total) by mouth daily.  . [DISCONTINUED] pravastatin (PRAVACHOL) 40 MG tablet Take 1 tablet (40 mg total) by mouth daily.  . [DISCONTINUED] sertraline (ZOLOFT) 100 MG tablet Take 1 tablet (100 mg total) by mouth daily.   No facility-administered encounter medications on file as of 06/10/2016.     Allergies: Patient has no known allergies.  Body mass index is 39.15 kg/m.  Blood pressure 110/75, pulse 76, height 5\' 9"  (1.753 m), weight 265 lb 1.6 oz (120.2 kg).   Review of Systems  Constitutional: Positive for activity change, fatigue and unexpected weight change. Negative for appetite change, chills, diaphoresis and fever.  HENT: Negative for congestion.   Eyes: Negative for visual disturbance.  Respiratory: Negative for cough, chest tightness and shortness of breath.   Cardiovascular: Negative for chest pain, palpitations and leg swelling.  Gastrointestinal: Negative for abdominal distention, constipation and diarrhea.  Endocrine: Negative for cold intolerance, heat intolerance, polydipsia, polyphagia and polyuria.  Genitourinary: Negative for difficulty urinating.  Musculoskeletal: Negative for arthralgias, back pain, gait problem, joint swelling, myalgias, neck pain and neck stiffness.  Skin: Negative for color change, pallor,  rash and wound.  Neurological: Negative for dizziness and weakness.  Psychiatric/Behavioral: Negative for agitation and self-injury.       Objective:   Physical Exam  Constitutional: He is oriented to person, place, and time. He appears well-developed and well-nourished. No distress.  HENT:  Head: Normocephalic and atraumatic.  Eyes: Conjunctivae and EOM are normal. Pupils are equal, round, and reactive to light.  Neck: Normal range of motion. Neck supple. No thyromegaly present.  Cardiovascular: Normal rate, regular rhythm, normal heart sounds and intact distal  pulses.   No murmur heard. Pulmonary/Chest: Effort normal. He has wheezes.  Abdominal: Soft. Bowel sounds are normal.  Musculoskeletal: Normal range of motion.  Lymphadenopathy:    He has no cervical adenopathy.  Neurological: He is alert and oriented to person, place, and time. He has normal reflexes.  Skin: Skin is warm and dry. He is not diaphoretic.  Psychiatric: He has a normal mood and affect. His behavior is normal. Judgment and thought content normal.  Nursing note and vitals reviewed.         Assessment & Plan:   1. Need for influenza vaccination   2. Other fatigue   3. Family history of diabetes mellitus   4. Other hyperlipidemia   5. Acquired hypothyroidism   6. Obesity, Class II, BMI 35-39.9     Acquired hypothyroidism Continue Levothyroxine as directed.  TSH level drawn today, will make medication adjustment if warranted.  Other hyperlipidemia Continue Rosuvastatin 40mg  daily.  Reduce saturated fat intake.  Increase regular exercise.  Other fatigue Increase daily water intake.  Increase regular exercise and improve diet.  Family history of diabetes mellitus Reduce sugar/CHO intake.  Increase regular exercise.  Need for influenza vaccination Influenza vaccination given in clinic today.  Obesity, Class II, BMI 35-39.9 Information provided on regular exercise habits and healthy eating.      FOLLOW-UP:  Return in about 1 year (around 06/10/2017) for Regular Follow Up.

## 2016-06-11 LAB — CBC WITH DIFFERENTIAL/PLATELET
Basophils Absolute: 0 10*3/uL (ref 0.0–0.2)
Basos: 0 %
EOS (ABSOLUTE): 0.3 10*3/uL (ref 0.0–0.4)
EOS: 4 %
HEMATOCRIT: 44 % (ref 37.5–51.0)
HEMOGLOBIN: 15.5 g/dL (ref 13.0–17.7)
IMMATURE GRANULOCYTES: 1 %
Immature Grans (Abs): 0.1 10*3/uL (ref 0.0–0.1)
LYMPHS: 29 %
Lymphocytes Absolute: 2.2 10*3/uL (ref 0.7–3.1)
MCH: 29.9 pg (ref 26.6–33.0)
MCHC: 35.2 g/dL (ref 31.5–35.7)
MCV: 85 fL (ref 79–97)
MONOCYTES: 8 %
MONOS ABS: 0.6 10*3/uL (ref 0.1–0.9)
NEUTROS PCT: 58 %
Neutrophils Absolute: 4.5 10*3/uL (ref 1.4–7.0)
Platelets: 155 10*3/uL (ref 150–379)
RBC: 5.19 x10E6/uL (ref 4.14–5.80)
RDW: 13.7 % (ref 12.3–15.4)
WBC: 7.8 10*3/uL (ref 3.4–10.8)

## 2016-06-11 LAB — LIPID PANEL
CHOL/HDL RATIO: 4.9 ratio (ref 0.0–5.0)
Cholesterol, Total: 161 mg/dL (ref 100–199)
HDL: 33 mg/dL — AB (ref >39)
LDL CALC: 99 mg/dL (ref 0–99)
TRIGLYCERIDES: 146 mg/dL (ref 0–149)
VLDL Cholesterol Cal: 29 mg/dL (ref 5–40)

## 2016-06-11 LAB — COMPREHENSIVE METABOLIC PANEL
ALT: 37 IU/L (ref 0–44)
AST: 22 IU/L (ref 0–40)
Albumin/Globulin Ratio: 1.8 (ref 1.2–2.2)
Albumin: 4.6 g/dL (ref 3.5–5.5)
Alkaline Phosphatase: 65 IU/L (ref 39–117)
BUN/Creatinine Ratio: 12 (ref 9–20)
BUN: 12 mg/dL (ref 6–24)
Bilirubin Total: 0.5 mg/dL (ref 0.0–1.2)
CALCIUM: 9.8 mg/dL (ref 8.7–10.2)
CO2: 23 mmol/L (ref 18–29)
Chloride: 101 mmol/L (ref 96–106)
Creatinine, Ser: 1.03 mg/dL (ref 0.76–1.27)
GFR calc Af Amer: 97 mL/min/{1.73_m2} (ref >59)
GFR, EST NON AFRICAN AMERICAN: 84 mL/min/{1.73_m2} (ref >59)
GLOBULIN, TOTAL: 2.5 g/dL (ref 1.5–4.5)
Glucose: 85 mg/dL (ref 65–99)
Potassium: 4.5 mmol/L (ref 3.5–5.2)
SODIUM: 140 mmol/L (ref 134–144)
Total Protein: 7.1 g/dL (ref 6.0–8.5)

## 2016-06-11 LAB — VITAMIN D 25 HYDROXY (VIT D DEFICIENCY, FRACTURES): Vit D, 25-Hydroxy: 27 ng/mL — ABNORMAL LOW (ref 30.0–100.0)

## 2016-06-11 LAB — HEMOGLOBIN A1C
Est. average glucose Bld gHb Est-mCnc: 128 mg/dL
Hgb A1c MFr Bld: 6.1 % — ABNORMAL HIGH (ref 4.8–5.6)

## 2016-06-11 LAB — TSH: TSH: 5.06 u[IU]/mL — AB (ref 0.450–4.500)

## 2016-06-16 ENCOUNTER — Other Ambulatory Visit: Payer: Self-pay | Admitting: Adult Health

## 2016-06-16 DIAGNOSIS — E559 Vitamin D deficiency, unspecified: Secondary | ICD-10-CM

## 2016-06-16 MED ORDER — LEVOTHYROXINE SODIUM 75 MCG PO TABS: 75.0000 ug | ORAL_TABLET | Freq: Every day | ORAL | 3 refills | Status: DC

## 2016-06-16 MED ORDER — VITAMIN D (ERGOCALCIFEROL) 1.25 MG (50000 UNIT) PO CAPS: 50000.0000 [IU] | ORAL_CAPSULE | ORAL | 0 refills | Status: DC

## 2016-06-16 NOTE — Progress Notes (Unsigned)
ergoc

## 2016-07-15 ENCOUNTER — Other Ambulatory Visit: Payer: Self-pay

## 2016-07-15 DIAGNOSIS — E039 Hypothyroidism, unspecified: Secondary | ICD-10-CM

## 2016-07-16 ENCOUNTER — Other Ambulatory Visit (INDEPENDENT_AMBULATORY_CARE_PROVIDER_SITE_OTHER): Payer: 59

## 2016-07-16 DIAGNOSIS — E039 Hypothyroidism, unspecified: Secondary | ICD-10-CM

## 2016-07-17 LAB — TSH: TSH: 2.31 u[IU]/mL (ref 0.450–4.500)

## 2016-08-22 ENCOUNTER — Encounter (HOSPITAL_COMMUNITY): Payer: Self-pay | Admitting: *Deleted

## 2016-08-22 ENCOUNTER — Ambulatory Visit (HOSPITAL_COMMUNITY)
Admission: EM | Admit: 2016-08-22 | Discharge: 2016-08-22 | Disposition: A | Discharge status: Home / Self Care | Payer: 59 | Attending: Family Medicine | Admitting: Family Medicine

## 2016-08-22 DIAGNOSIS — H8111 Benign paroxysmal vertigo, right ear: Secondary | ICD-10-CM

## 2016-08-22 DIAGNOSIS — R42 Dizziness and giddiness: Secondary | ICD-10-CM | POA: Diagnosis not present

## 2016-08-22 HISTORY — DX: Pure hypercholesterolemia, unspecified: E78.00

## 2016-08-22 MED ORDER — MECLIZINE HCL 25 MG PO TABS: 25.0000 mg | ORAL_TABLET | Freq: Three times a day (TID) | ORAL | 0 refills | Status: DC | PRN

## 2016-08-22 NOTE — ED Provider Notes (Addendum)
[place]    CSN: [id number] Arrival date & time: 08/22/16  1522     History   Chief Complaint Chief Complaint  Patient presents with  . Dizziness    HPI Elijah Small is a 51 y.o. male.   The history is provided by the patient. No language interpreter was used.  Dizziness  Quality:  Room spinning and imbalance Severity:  Mild Onset quality:  Sudden Duration:  3 days Timing:  Intermittent Progression:  Waxing and waning Chronicity:  New Context: bending over and head movement   Context: not with ear pain, not with eye movement, not with loss of consciousness, not when standing up and not when urinating   Relieved by:  Being still Worsened by:  Movement and turning head Ineffective treatments:  None tried Associated symptoms: no blood in stool, no chest pain, no headaches, no hearing loss, no nausea, no palpitations, no shortness of breath, no syncope, no tinnitus, no vision changes, no vomiting and no weakness   Risk factors: no hx of vertigo     Past Medical History:  Diagnosis Date  . Anxiety   . Diverticulitis   . GERD (gastroesophageal reflux disease)   . History of colon polyps   . Hypercholesteremia   . Sleep apnea   . Thyroid disease     Patient Active Problem List   Diagnosis Date Noted  . Other fatigue 06/10/2016  . Family history of diabetes mellitus 06/10/2016  . Acquired hypothyroidism 06/10/2016  . Need for influenza vaccination 06/10/2016  . Obesity, Class II, BMI 35-39.9 06/10/2016  . Need for prophylactic vaccination with combined diphtheria-tetanus-pertussis (DTP) vaccine 08/06/2013  . Anxiety state, unspecified 02/04/2013  . Other hyperlipidemia 02/04/2013  . GERD (gastroesophageal reflux disease)   . Diverticulitis   . Sleep apnea   . History of colon polyps     Past Surgical History:  Procedure Laterality Date  . COLECTOMY     Dr Margot Chimes - 12 inches  . KNEE SURGERY Left        Home Medications    Prior  to Admission medications   Medication Sig Start Date End Date Taking? Authorizing Provider  levothyroxine (SYNTHROID, LEVOTHROID) 75 MCG tablet Take 1 tablet (75 mcg total) by mouth daily. 06/16/16  Yes Odella Aquas, NP  rosuvastatin (CRESTOR) 40 MG tablet Take 1 tablet by mouth daily. 05/14/16  Yes Historical Provider, MD  sertraline (ZOLOFT) 100 MG tablet Take 50 mg by mouth daily.   Yes Historical Provider, MD  Vitamin D, Ergocalciferol, (DRISDOL) 50000 units CAPS capsule Take 1 capsule (50,000 Units total) by mouth every 7 (seven) days. 06/16/16  Yes Odella Aquas, NP    Family History Family History  Problem Relation Age of Onset  . Hypertension Father   . Heart disease Father     He died at 26 from MI   . Stroke Mother   . Dementia Mother   . Cancer Brother     thyroid  . Diabetes Brother   . Hypertension Brother     Social History Social History  Substance Use Topics  . Smoking status: Never Smoker  . Smokeless tobacco: Current User    Types: Snuff  . Alcohol use No     Allergies   Patient has no known allergies.   Review of Systems Review of Systems  HENT: Negative for hearing loss and tinnitus.   Respiratory: Negative for shortness of breath.   Cardiovascular: Negative for chest pain, palpitations  and syncope.  Gastrointestinal: Negative for blood in stool, nausea and vomiting.  Musculoskeletal: Negative.   Neurological: Positive for dizziness. Negative for syncope, weakness, numbness and headaches.  All other systems reviewed and are negative.    Physical Exam Triage Vital Signs ED Triage Vitals [08/22/16 1540]  Enc Vitals Group     BP 103/68     Pulse Rate 69     Resp 16     Temp 97.9 F (36.6 C)     Temp Source Oral     SpO2 98 %     Weight      Height      Head Circumference      Peak Flow      Pain Score      Pain Loc      Pain Edu?      Excl. in [place]?    No data found.   Updated Vital Signs BP 103/68   Pulse 69   Temp 97.9 F (36.6 C)  (Oral)   Resp 16   SpO2 98%   Visual Acuity Right Eye Distance:   Left Eye Distance:   Bilateral Distance:    Right Eye Near:   Left Eye Near:    Bilateral Near:     Physical Exam  Constitutional: No distress.  HENT:  Head: Normocephalic.  Right Ear: Tympanic membrane, external ear and ear canal normal.  Left Ear: Tympanic membrane, external ear and ear canal normal.  Dix-Hallpike positive with dizziness when head is moved to the right. No Nystagmus  Eyes: Conjunctivae and EOM are normal. Pupils are equal, round, and reactive to light.  Neck: Full passive range of motion without pain. Neck supple.  Cardiovascular: Normal rate, regular rhythm and normal heart sounds.  Exam reveals no friction rub.   No murmur heard. Pulmonary/Chest: Effort normal. He has no decreased breath sounds. He has wheezes in the left middle field and the left lower field. He has no rhonchi. He has no rales.  Neurological: He is alert. No sensory deficit. He displays a negative Romberg sign. Gait normal.  Nursing note and vitals reviewed.    UC Treatments / Results  Labs (all labs ordered are listed, but only abnormal results are displayed) Labs Reviewed - No data to display  EKG  EKG Interpretation None       Radiology No results found.  Procedures Procedures (including critical care time)  Medications Ordered in UC Medications - No data to display   Initial Impression / Assessment and Plan / UC Course  I have reviewed the triage vital signs and the nursing notes.  Pertinent labs & imaging results that were available during my care of the patient were reviewed by me and considered in my medical decision making (see chart for details).  Clinical Course as of Aug 23 1619  Sun Aug 22, 2016  1620 Dizziness: Likely BPPV BP low normal. Per patient and his partner this is baseline for him. Epley's maneuver discussed. Start Meclizine prn dizziness for few days. If no improvement of if  symptoms worsens he might benefit from vestibular rehab. PCP f/u recommended in the next few days. He agreed with plan.   [KE]    Clinical Course User Index [KE] Kinnie Feil, MD      Final Clinical Impressions(s) / UC Diagnoses   Final diagnoses:  None   Dizziness  Benign paroxysmal positional vertigo of right ear  New Prescriptions New Prescriptions   No medications on file  Kinnie Feil, MD 08/22/16 [place], MD 08/22/16 1900

## 2016-08-22 NOTE — ED Triage Notes (Signed)
C/O episodes of dizziness when tilting head back or bending forward over past 3 days.  States if he keeps his head in normal position, no dizziness.  Denies any other c/o's.

## 2016-08-22 NOTE — Discharge Instructions (Signed)
It was nice seeing you today. You likely have a form of vertigo which is benign and self limiting. Use Meclizine for symptomatic treatment. See PCP soon if no improvement or worsening of your symptoms.

## 2016-08-31 ENCOUNTER — Ambulatory Visit: Payer: Managed Care, Other (non HMO) | Admitting: Family Medicine

## 2016-11-02 ENCOUNTER — Other Ambulatory Visit: Payer: Self-pay | Admitting: Adult Health

## 2016-11-02 ENCOUNTER — Telehealth: Payer: Self-pay | Admitting: Family Medicine

## 2016-11-02 DIAGNOSIS — F411 Generalized anxiety disorder: Secondary | ICD-10-CM

## 2016-11-02 MED ORDER — SERTRALINE HCL 100 MG PO TABS: 50.0000 mg | ORAL_TABLET | Freq: Every day | ORAL | 2 refills | Status: DC

## 2016-11-02 NOTE — Telephone Encounter (Signed)
PT called states there are no refills on his Zoloft-- Previous provider not PCFO prescribed (Sertralin 100MG ) --pt takes 50MG  daily. --glh

## 2016-11-02 NOTE — Telephone Encounter (Signed)
RF sent in. He intended to establish with Dr. Raliegh Scarlet, however her could not be seen prior to her medical leave. I saw him to establish care, next OV he should be seen by his intended.

## 2016-12-14 ENCOUNTER — Encounter: Payer: Self-pay | Admitting: Adult Health

## 2016-12-14 ENCOUNTER — Ambulatory Visit (INDEPENDENT_AMBULATORY_CARE_PROVIDER_SITE_OTHER): Payer: 59 | Admitting: Adult Health

## 2016-12-14 VITALS — BP 98/64 | HR 69 | Ht 69.0 in | Wt 240.2 lb

## 2016-12-14 DIAGNOSIS — F411 Generalized anxiety disorder: Secondary | ICD-10-CM | POA: Diagnosis not present

## 2016-12-14 DIAGNOSIS — D229 Melanocytic nevi, unspecified: Secondary | ICD-10-CM

## 2016-12-14 DIAGNOSIS — E669 Obesity, unspecified: Secondary | ICD-10-CM | POA: Diagnosis not present

## 2016-12-14 DIAGNOSIS — H8110 Benign paroxysmal vertigo, unspecified ear: Secondary | ICD-10-CM | POA: Diagnosis not present

## 2016-12-14 DIAGNOSIS — E039 Hypothyroidism, unspecified: Secondary | ICD-10-CM

## 2016-12-14 DIAGNOSIS — E7849 Other hyperlipidemia: Secondary | ICD-10-CM

## 2016-12-14 DIAGNOSIS — E784 Other hyperlipidemia: Secondary | ICD-10-CM | POA: Diagnosis not present

## 2016-12-14 MED ORDER — ROSUVASTATIN CALCIUM 40 MG PO TABS: 40.0000 mg | ORAL_TABLET | Freq: Every day | ORAL | 2 refills | Status: DC

## 2016-12-14 MED ORDER — MONTELUKAST SODIUM 10 MG PO TABS: 10.0000 mg | ORAL_TABLET | Freq: Every day | ORAL | 3 refills | Status: DC

## 2016-12-14 NOTE — Progress Notes (Signed)
Subjective:    Patient ID: Elijah Small, male    DOB: [birth year], 51 y.o.   MRN: [id number]  HPI:  [name] is here for f/u: Anxiety/depression.  He has been using 1/2 tab of Sertraline 100mg  daily and denies thoughts of harming himself/others.  He has eliminated soda and snacking in between meals and has last 25lbs since last OV.  He does not formally exercise, however moves all day working on cars.  He was seen at [facility] April 2018-dx'd with BPPV, however never required any Meclizine and denies any dizziness/HAs since then. He also reports increased constant nasal congestion and few episodes of wheezing.  He denies personal dx of asthma, however two of his children do have reactive airway diease.  He also had "a strange mole" develop on RUE last week after a trip to the beach.  He denies ever been treated/seen by dermatology.  He continues to use chewing tobacco daily and declined tobacco cessation today.  Patient Care Team    Relationship Specialty Notifications Start End  Esaw Grandchild, NP PCP - General Family Medicine  12/10/16     Patient Active Problem List   Diagnosis Date Noted  . Atypical mole 12/14/2016  . BPPV (benign paroxysmal positional vertigo) 12/14/2016  . Other fatigue 06/10/2016  . Family history of diabetes mellitus 06/10/2016  . Acquired hypothyroidism 06/10/2016  . Need for influenza vaccination 06/10/2016  . Obesity, Class II, BMI 35-39.9 06/10/2016  . Need for prophylactic vaccination with combined diphtheria-tetanus-pertussis (DTP) vaccine 08/06/2013  . Anxiety state 02/04/2013  . Other hyperlipidemia 02/04/2013  . GERD (gastroesophageal reflux disease)   . Diverticulitis   . Sleep apnea   . History of colon polyps      Past Medical History:  Diagnosis Date  . Anxiety   . Diverticulitis   . GERD (gastroesophageal reflux disease)   . History of colon polyps   . Hypercholesteremia   . Sleep apnea   . Thyroid disease      Past Surgical  History:  Procedure Laterality Date  . COLECTOMY     Dr Margot Chimes - 12 inches  . KNEE SURGERY Left      Family History  Problem Relation Age of Onset  . Hypertension Father   . Heart disease Father        He died at 66 from MI   . Stroke Mother   . Dementia Mother   . Cancer Brother        thyroid  . Diabetes Brother   . Hypertension Brother      History  Drug Use No     History  Alcohol Use No     History  Smoking Status  . Never Smoker  Smokeless Tobacco  . Current User  . Types: Snuff     Outpatient Encounter Prescriptions as of 12/14/2016  Medication Sig  . Cholecalciferol (VITAMIN D3) 2000 units TABS Take 1 tablet by mouth daily.  Marland Kitchen levothyroxine (SYNTHROID, LEVOTHROID) 75 MCG tablet Take 1 tablet (75 mcg total) by mouth daily.  . rosuvastatin (CRESTOR) 40 MG tablet Take 1 tablet (40 mg total) by mouth daily.  . sertraline (ZOLOFT) 100 MG tablet Take 0.5 tablets (50 mg total) by mouth daily.  . [DISCONTINUED] meclizine (ANTIVERT) 25 MG tablet Take 1 tablet (25 mg total) by mouth 3 (three) times daily as needed for dizziness.  . [DISCONTINUED] rosuvastatin (CRESTOR) 40 MG tablet Take 1 tablet by mouth daily.  . [DISCONTINUED] Vitamin D,  Ergocalciferol, (DRISDOL) 50000 units CAPS capsule Take 1 capsule (50,000 Units total) by mouth every 7 (seven) days.  . montelukast (SINGULAIR) 10 MG tablet Take 1 tablet (10 mg total) by mouth at bedtime.   No facility-administered encounter medications on file as of 12/14/2016.     Allergies: Patient has no known allergies.  Body mass index is 35.47 kg/m.  Blood pressure 98/64, pulse 69, height 5\' 9"  (1.753 m), weight 240 lb 3.2 oz (109 kg).     Review of Systems  Constitutional: Negative for activity change, appetite change, chills, diaphoresis, fatigue, fever and unexpected weight change.  HENT: Positive for congestion, sinus pressure and sneezing.   Eyes: Negative for visual disturbance.  Respiratory: Positive  for wheezing. Negative for cough, chest tightness, shortness of breath and stridor.   Cardiovascular: Negative for chest pain, palpitations and leg swelling.  Neurological: Negative for dizziness, tremors, weakness, light-headedness and headaches.  Hematological: Does not bruise/bleed easily.  Psychiatric/Behavioral: Negative for dysphoric mood, self-injury, sleep disturbance and suicidal ideas. The patient is not nervous/anxious.        Objective:   Physical Exam  Constitutional: He is oriented to person, place, and time. He appears well-developed and well-nourished. No distress.  HENT:  Head: Normocephalic and atraumatic.  Right Ear: Hearing, tympanic membrane, external ear and ear canal normal. Tympanic membrane is not erythematous and not bulging. No decreased hearing is noted.  Left Ear: Tympanic membrane, external ear and ear canal normal. Tympanic membrane is not erythematous and not bulging. No decreased hearing is noted.  Nose: Mucosal edema and rhinorrhea present. Right sinus exhibits no maxillary sinus tenderness and no frontal sinus tenderness. Left sinus exhibits no maxillary sinus tenderness and no frontal sinus tenderness.  Mouth/Throat: Uvula is midline, oropharynx is clear and moist and mucous membranes are normal.  Eyes: Pupils are equal, round, and reactive to light. Conjunctivae are normal.  Neck: Normal range of motion. Neck supple.  Cardiovascular: Normal rate, regular rhythm, normal heart sounds and intact distal pulses.   No murmur heard. Pulmonary/Chest: Effort normal and breath sounds normal.  Lymphadenopathy:    He has no cervical adenopathy.  Neurological: He is alert and oriented to person, place, and time. Coordination normal.  Skin: Skin is warm and dry. Lesion noted. He is not diaphoretic.     1 atypical mole, circular 0.5cm in diameter. Center is blackened and crusted. No open tissue/drainge noted.   Psychiatric: He has a normal mood and affect. His  behavior is normal. Judgment and thought content normal.  Nursing note and vitals reviewed.         Assessment & Plan:   1. Atypical mole   2. Acquired hypothyroidism   3. Anxiety state   4. Other hyperlipidemia   5. Obesity, Class II, BMI 35-39.9   6. Benign paroxysmal positional vertigo, unspecified laterality     Atypical mole 1 circular mole 0.5cm in diameter with blackened/thick scaly center. Has never had "mole check" or OV with dermatology. Dermatology referral placed.  Acquired hypothyroidism Last TSH 07/17/15- 2.310 Continue Levothyroxine 43mcg Reports significantly reduced fatigue.   Anxiety state Continue 1/2 tab Sertraline 100mg  daily. Declined CBT referral.  Other hyperlipidemia 25 lb wt loss since last OV 06/10/16-EXCELLENT! Continue heart healthy diet and rosuvastatin 40mg  daily.   Obesity, Class II, BMI 35-39.9 25 lb wt loss since last OV 06/27/16 Increased daily movement and stopped drinking soda and snacking in between meals.  Following heart healthy diet. Goal wt 200  BPPV (benign  paroxysmal positional vertigo) UC visit 08/22/16 dx'd with BPPV Provided Meclizine 25mg -however did not require any medication. He denies any dizziness or HAs since UC visit.    FOLLOW-UP:  Return in about 3 months (around 03/16/2017) for CPE.

## 2016-12-14 NOTE — Assessment & Plan Note (Signed)
Last TSH 07/17/15- 2.310 Continue Levothyroxine 66mcg Reports significantly reduced fatigue.

## 2016-12-14 NOTE — Assessment & Plan Note (Signed)
25 lb wt loss since last OV 06/10/16-EXCELLENT! Continue heart healthy diet and rosuvastatin 40mg  daily.

## 2016-12-14 NOTE — Assessment & Plan Note (Signed)
UC visit 08/22/16 dx'd with BPPV Provided Meclizine 25mg -however did not require any medication. He denies any dizziness or HAs since UC visit.

## 2016-12-14 NOTE — Assessment & Plan Note (Signed)
1 circular mole 0.5cm in diameter with blackened/thick scaly center. Has never had "mole check" or OV with dermatology. Dermatology referral placed.

## 2016-12-14 NOTE — Patient Instructions (Signed)
Heart-Healthy Eating Plan Many factors influence your heart health, including eating and exercise habits. Heart (coronary) risk increases with abnormal blood fat (lipid) levels. Heart-healthy meal planning includes limiting unhealthy fats, increasing healthy fats, and making other small dietary changes. This includes maintaining a healthy body weight to help keep lipid levels within a normal range. What is my plan? Your health care provider recommends that you:  Get no more than _________% of the total calories in your daily diet from fat.  Limit your intake of saturated fat to less than _________% of your total calories each day.  Limit the amount of cholesterol in your diet to less than _________ mg per day.  What types of fat should I choose?  Choose healthy fats more often. Choose monounsaturated and polyunsaturated fats, such as olive oil and canola oil, flaxseeds, walnuts, almonds, and seeds.  Eat more omega-3 fats. Good choices include salmon, mackerel, sardines, tuna, flaxseed oil, and ground flaxseeds. Aim to eat fish at least two times each week.  Limit saturated fats. Saturated fats are primarily found in animal products, such as meats, butter, and cream. Plant sources of saturated fats include palm oil, palm kernel oil, and coconut oil.  Avoid foods with partially hydrogenated oils in them. These contain trans fats. Examples of foods that contain trans fats are stick margarine, some tub margarines, cookies, crackers, and other baked goods. What general guidelines do I need to follow?  Check food labels carefully to identify foods with trans fats or high amounts of saturated fat.  Fill one half of your plate with vegetables and green salads. Eat 4-5 servings of vegetables per day. A serving of vegetables equals 1 cup of raw leafy vegetables,  cup of raw or cooked cut-up vegetables, or  cup of vegetable juice.  Fill one fourth of your plate with whole grains. Look for the word  "whole" as the first word in the ingredient list.  Fill one fourth of your plate with lean protein foods.  Eat 4-5 servings of fruit per day. A serving of fruit equals one medium whole fruit,  cup of dried fruit,  cup of fresh, frozen, or canned fruit, or  cup of 100% fruit juice.  Eat more foods that contain soluble fiber. Examples of foods that contain this type of fiber are apples, broccoli, carrots, beans, peas, and barley. Aim to get 20-30 g of fiber per day.  Eat more home-cooked food and less restaurant, buffet, and fast food.  Limit or avoid alcohol.  Limit foods that are high in starch and sugar.  Avoid fried foods.  Cook foods by using methods other than frying. Baking, boiling, grilling, and broiling are all great options. Other fat-reducing suggestions include: ? Removing the skin from poultry. ? Removing all visible fats from meats. ? Skimming the fat off of stews, soups, and gravies before serving them. ? Steaming vegetables in water or broth.  Lose weight if you are overweight. Losing just 5-10% of your initial body weight can help your overall health and prevent diseases such as diabetes and heart disease.  Increase your consumption of nuts, legumes, and seeds to 4-5 servings per week. One serving of dried beans or legumes equals  cup after being cooked, one serving of nuts equals 1 ounces, and one serving of seeds equals  ounce or 1 tablespoon.  You may need to monitor your salt (sodium) intake, especially if you have high blood pressure. Talk with your health care provider or dietitian to get  more information about reducing sodium. What foods can I eat? Grains  Breads, including [place], white, pita, wheat, raisin, rye, oatmeal, and [place]. Tortillas that are neither fried nor made with lard or trans fat. Low-fat rolls, including hotdog and hamburger buns and English muffins. Biscuits. Muffins. Waffles. Pancakes. Light popcorn. Whole-grain cereals. Flatbread.  Melba toast. Pretzels. Breadsticks. Rusks. Low-fat snacks and crackers, including oyster, saltine, matzo, graham, animal, and rye. Rice and pasta, including brown rice and those that are made with whole wheat. Vegetables All vegetables. Fruits All fruits, but limit coconut. Meats and Other Protein Sources Lean, well-trimmed beef, veal, pork, and lamb. Chicken and [place] without skin. All fish and shellfish. Wild duck, rabbit, pheasant, and venison. Egg whites or low-cholesterol egg substitutes. Dried beans, peas, lentils, and tofu.Seeds and most nuts. Dairy Low-fat or nonfat cheeses, including ricotta, string, and mozzarella. Skim or 1% milk that is liquid, powdered, or evaporated. Buttermilk that is made with low-fat milk. Nonfat or low-fat yogurt. Beverages Mineral water. Diet carbonated beverages. Sweets and Desserts Sherbets and fruit ices. Honey, jam, marmalade, jelly, and syrups. Meringues and gelatins. Pure sugar candy, such as hard candy, jelly beans, gumdrops, mints, marshmallows, and small amounts of dark chocolate. [employer] Eat all sweets and desserts in moderation. Fats and Oils Nonhydrogenated (trans-free) margarines. Vegetable oils, including soybean, sesame, sunflower, olive, peanut, safflower, corn, canola, and cottonseed. Salad dressings or mayonnaise that are made with a vegetable oil. Limit added fats and oils that you use for cooking, baking, salads, and as spreads. Other Cocoa powder. Coffee and tea. All seasonings and condiments. The items listed above may not be a complete list of recommended foods or beverages. Contact your dietitian for more options. What foods are not recommended? Grains Breads that are made with saturated or trans fats, oils, or whole milk. Croissants. Butter rolls. Cheese breads. Sweet rolls. Donuts. Buttered popcorn. Chow mein noodles. High-fat crackers, such as cheese or butter crackers. Meats and Other Protein Sources Fatty meats, such  as hotdogs, short ribs, sausage, spareribs, bacon, ribeye roast or steak, and mutton. High-fat deli meats, such as salami and bologna. Caviar. Domestic duck and goose. Organ meats, such as kidney, liver, sweetbreads, brains, gizzard, chitterlings, and heart. Dairy Cream, sour cream, cream cheese, and creamed cottage cheese. Whole milk cheeses, including blue (bleu), [place], [place], [place], [place], [place], [place], [place], [place], and [place]. Whole or 2% milk that is liquid, evaporated, or condensed. Whole buttermilk. Cream sauce or high-fat cheese sauce. Yogurt that is made from whole milk. Beverages Regular sodas and drinks with added sugar. Sweets and Desserts Frosting. Pudding. Cookies. Cakes other than angel food cake. Candy that has milk chocolate or white chocolate, hydrogenated fat, butter, coconut, or unknown ingredients. Buttered syrups. Full-fat ice cream or ice cream drinks. Fats and Oils Gravy that has suet, meat fat, or shortening. Cocoa butter, hydrogenated oils, palm oil, coconut oil, palm kernel oil. These can often be found in baked products, candy, fried foods, nondairy creamers, and whipped toppings. Solid fats and shortenings, including bacon fat, salt pork, lard, and butter. Nondairy cream substitutes, such as coffee creamers and sour cream substitutes. Salad dressings that are made of unknown oils, cheese, or sour cream. The items listed above may not be a complete list of foods and beverages to avoid. Contact your dietitian for more information. This information is not intended to replace advice given to you by your health care provider. Make sure you discuss any questions you have with your health care  provider. Document Released: 02/03/2008 Document Revised: 11/14/2015 Document Reviewed: 10/18/2013 Elsevier Interactive Patient Education  2017 Elsevier Inc.  Sertraline tablets What is this medicine? SERTRALINE (SER tra leen) is used to treat depression. It may also  be used to treat obsessive compulsive disorder, panic disorder, post-trauma stress, premenstrual dysphoric disorder (PMDD) or social anxiety. This medicine may be used for other purposes; ask your health care provider or pharmacist if you have questions. COMMON BRAND NAME(S): Zoloft What should I tell my health care provider before I take this medicine? They need to know if you have any of these conditions: -bleeding disorders -bipolar disorder or a family history of bipolar disorder -glaucoma -heart disease -high blood pressure -history of irregular heartbeat -history of low levels of calcium, magnesium, or potassium in the blood -if you often drink alcohol -liver disease -receiving electroconvulsive therapy -seizures -suicidal thoughts, plans, or attempt; a previous suicide attempt by you or a family member -take medicines that treat or prevent blood clots -thyroid disease -an unusual or allergic reaction to sertraline, other medicines, foods, dyes, or preservatives -pregnant or trying to get pregnant -breast-feeding How should I use this medicine? Take this medicine by mouth with a glass of water. Follow the directions on the prescription label. You can take it with or without food. Take your medicine at regular intervals. Do not take your medicine more often than directed. Do not stop taking this medicine suddenly except upon the advice of your doctor. Stopping this medicine too quickly may cause serious side effects or your condition may worsen. A special MedGuide will be given to you by the pharmacist with each prescription and refill. Be sure to read this information carefully each time. Talk to your pediatrician regarding the use of this medicine in children. While this drug may be prescribed for children as young as 7 years for selected conditions, precautions do apply. Overdosage: If you think you have taken too much of this medicine contact a poison control center or emergency  room at once. NOTE: This medicine is only for you. Do not share this medicine with others. What if I miss a dose? If you miss a dose, take it as soon as you can. If it is almost time for your next dose, take only that dose. Do not take double or extra doses. What may interact with this medicine? Do not take this medicine with any of the following medications: -cisapride -dofetilide -dronedarone -linezolid -MAOIs like Carbex, Eldepryl, Marplan, Nardil, and Parnate -methylene blue (injected into a vein) -pimozide -thioridazine This medicine may also interact with the following medications: -alcohol -amphetamines -aspirin and aspirin-like medicines -certain medicines for depression, anxiety, or psychotic disturbances -certain medicines for fungal infections like ketoconazole, fluconazole, posaconazole, and itraconazole -certain medicines for irregular heart beat like flecainide, quinidine, propafenone -certain medicines for migraine headaches like almotriptan, eletriptan, frovatriptan, naratriptan, rizatriptan, sumatriptan, zolmitriptan -certain medicines for sleep -certain medicines for seizures like carbamazepine, valproic acid, phenytoin -certain medicines that treat or prevent blood clots like warfarin, enoxaparin, dalteparin -cimetidine -digoxin -diuretics -fentanyl -isoniazid -lithium -NSAIDs, medicines for pain and inflammation, like ibuprofen or naproxen -other medicines that prolong the QT interval (cause an abnormal heart rhythm) -rasagiline -safinamide -supplements like St. John's wort, kava kava, valerian -tolbutamide -tramadol -tryptophan This list may not describe all possible interactions. Give your health care provider a list of all the medicines, herbs, non-prescription drugs, or dietary supplements you use. Also tell them if you smoke, drink alcohol, or use illegal drugs. Some items may  interact with your medicine. What should I watch for while using this  medicine? Tell your doctor if your symptoms do not get better or if they get worse. Visit your doctor or health care professional for regular checks on your progress. Because it may take several weeks to see the full effects of this medicine, it is important to continue your treatment as prescribed by your doctor. Patients and their families should watch out for new or worsening thoughts of suicide or depression. Also watch out for sudden changes in feelings such as feeling anxious, agitated, panicky, irritable, hostile, aggressive, impulsive, severely restless, overly excited and hyperactive, or not being able to sleep. If this happens, especially at the beginning of treatment or after a change in dose, call your health care professional. Dennis Bast may get drowsy or dizzy. Do not drive, use machinery, or do anything that needs mental alertness until you know how this medicine affects you. Do not stand or sit up quickly, especially if you are an older patient. This reduces the risk of dizzy or fainting spells. Alcohol may interfere with the effect of this medicine. Avoid alcoholic drinks. Your mouth may get dry. Chewing sugarless gum or sucking hard candy, and drinking plenty of water may help. Contact your doctor if the problem does not go away or is severe. What side effects may I notice from receiving this medicine? Side effects that you should report to your doctor or health care professional as soon as possible: -allergic reactions like skin rash, itching or hives, swelling of the face, lips, or tongue -anxious -black, tarry stools -changes in vision -confusion -elevated mood, decreased need for sleep, racing thoughts, impulsive behavior -eye pain -fast, irregular heartbeat -feeling faint or lightheaded, falls -feeling agitated, angry, or irritable -hallucination, loss of contact with reality -loss of balance or coordination -loss of memory -painful or prolonged erections -restlessness, pacing,  inability to keep still -seizures -stiff muscles -suicidal thoughts or other mood changes -trouble sleeping -unusual bleeding or bruising -unusually weak or tired -vomiting Side effects that usually do not require medical attention (report to your doctor or health care professional if they continue or are bothersome): -change in appetite or weight -change in sex drive or performance -diarrhea -increased sweating -indigestion, nausea -tremors This list may not describe all possible side effects. Call your doctor for medical advice about side effects. You may report side effects to FDA at 1-800-FDA-1088. Where should I keep my medicine? Keep out of the reach of children. Store at room temperature between 15 and 30 degrees C (59 and 86 degrees F). Throw away any unused medicine after the expiration date. NOTE: This sheet is a summary. It may not cover all possible information. If you have questions about this medicine, talk to your doctor, pharmacist, or health care provider.  2018 Elsevier/Gold Standard (2016-04-30 14:17:49)  Started on Singular 10mg  daily.  Increase water intake, strive for at least 120 ounces/daily. Dermatology referral placed. Continue all medications as directed. Consider reducing-stopping chewing. Please schedule complete physical by end of year. EXCELLENT JOB ON THE WEIGHT LOSS!

## 2016-12-14 NOTE — Assessment & Plan Note (Signed)
25 lb wt loss since last OV 06/27/16 Increased daily movement and stopped drinking soda and snacking in between meals.  Following heart healthy diet. Goal wt 200

## 2016-12-14 NOTE — Assessment & Plan Note (Signed)
Continue 1/2 tab Sertraline 100mg  daily. Declined CBT referral.

## 2016-12-31 ENCOUNTER — Other Ambulatory Visit: Payer: Self-pay

## 2017-03-21 NOTE — Progress Notes (Signed)
Subjective:    Patient ID: Elijah Small, male    DOB: 02-May-1966, 51 y.o.   MRN: [id number]  HPI: OV 12/14/2016: Elijah Small is here for f/u: Anxiety/depression.  He has been using 1/2 tab of Sertraline 100mg  daily and denies thoughts of harming himself/others.  He has eliminated soda and snacking in between meals and has last 25lbs since last OV.  He does not formally exercise, however moves all day working on cars.  He was seen at [facility] April 2018-dx'd with BPPV, however never required any Meclizine and denies any dizziness/HAs since then. He also reports increased constant nasal congestion and few episodes of wheezing.  He denies personal dx of asthma, however two of his children do have reactive airway diease.  He also had "a strange mole" develop on RUE last week after a trip to the beach.  He denies ever been treated/seen by dermatology.  He continues to use chewing tobacco daily and declined tobacco cessation today.  OV 03/22/17: Elijah Small is here for CPE.  He denies any acute changes from last visit 12/14/16.  He is compliant on all medications and denies SE.  He feels that his mood is stable on Sertraline 50mg  and denies thoughts of harming himself/others.   Dermatology removed atypical mole from [place] 12/2016, path revealed: SUPERFICIAL SQUAMOUS CELL CARCINOMA, KERATOACANTHOMA-LIKE PATTERN. Instructed to f/u in 6 months, which would ne 06/2017. He estimates to drink 60 oz of water/day and follows a heart healthy diet. He continues to abstain from ETOH, however continues to use smokeless tobacco daily.  He declined smoking cessation today. Healthcare maintenance: Colonoscopy- ordered for 05/2017.  Guaiac testing negative.  He had 12" coloectomy, he cannot recall year. He denies first degree family hx of colon ca and denies current GI sx.'s.    Patient Care Team    Relationship Specialty Notifications Start End  Esaw Grandchild, NP PCP - General Family Medicine  12/10/16      Patient Active Problem List   Diagnosis Date Noted  . Screening for colon cancer 03/22/2017  . Healthcare maintenance 03/22/2017  . Atypical mole 12/14/2016  . BPPV (benign paroxysmal positional vertigo) 12/14/2016  . Other fatigue 06/10/2016  . Family history of diabetes mellitus 06/10/2016  . Acquired hypothyroidism 06/10/2016  . Need for influenza vaccination 06/10/2016  . Obesity, Class II, BMI 35-39.9 06/10/2016  . Need for prophylactic vaccination with combined diphtheria-tetanus-pertussis (DTP) vaccine 08/06/2013  . Anxiety state 02/04/2013  . Other hyperlipidemia 02/04/2013  . GERD (gastroesophageal reflux disease)   . Diverticulitis   . Sleep apnea   . History of colon polyps      Past Medical History:  Diagnosis Date  . Anxiety   . Diverticulitis   . GERD (gastroesophageal reflux disease)   . History of colon polyps   . Hypercholesteremia   . Sleep apnea   . Thyroid disease      Past Surgical History:  Procedure Laterality Date  . COLECTOMY     Dr Margot Chimes - 12 inches  . KNEE SURGERY Left      Family History  Problem Relation Age of Onset  . Hypertension Father   . Heart disease Father        He died at 70 from MI   . Stroke Mother   . Dementia Mother   . Cancer Brother        thyroid  . Diabetes Brother   . Hypertension Brother      Social History  Substance and Sexual Activity  Drug Use No     Social History   Substance and Sexual Activity  Alcohol Use No     Social History   Tobacco Use  Smoking Status Never Smoker  Smokeless Tobacco Current User  . Types: Snuff     Outpatient Encounter Medications as of 03/22/2017  Medication Sig  . Cholecalciferol (VITAMIN D3) 2000 units TABS Take 1 tablet by mouth daily.  Marland Kitchen levothyroxine (SYNTHROID, LEVOTHROID) 75 MCG tablet Take 1 tablet (75 mcg total) by mouth daily.  . rosuvastatin (CRESTOR) 40 MG tablet Take 1 tablet (40 mg total) by mouth daily.  . sertraline (ZOLOFT) 100 MG  tablet Take 0.5 tablets (50 mg total) by mouth daily.  . [DISCONTINUED] montelukast (SINGULAIR) 10 MG tablet Take 1 tablet (10 mg total) by mouth at bedtime.   No facility-administered encounter medications on file as of 03/22/2017.     Allergies: Patient has no known allergies.  Body mass index is 36.37 kg/m.  Blood pressure 107/67, pulse 80, height 5\' 9"  (1.753 m), weight 246 lb 4.8 oz (111.7 kg).     Review of Systems  Constitutional: Negative for activity change, appetite change, chills, diaphoresis, fatigue, fever and unexpected weight change.  HENT: Negative for congestion, sinus pressure and sneezing.   Eyes: Negative for visual disturbance.  Respiratory: Negative for cough, chest tightness, shortness of breath, wheezing and stridor.   Cardiovascular: Negative for chest pain, palpitations and leg swelling.  Gastrointestinal: Negative for abdominal distention, abdominal pain, blood in stool, constipation, diarrhea, nausea and vomiting.  Endocrine: Negative for cold intolerance, heat intolerance, polydipsia, polyphagia and polyuria.  Genitourinary: Negative for difficulty urinating and flank pain.  Musculoskeletal: Negative for arthralgias, back pain, gait problem, joint swelling, myalgias, neck pain and neck stiffness.  Skin: Negative for color change, pallor, rash and wound.  Neurological: Negative for dizziness, tremors, weakness, light-headedness and headaches.  Hematological: Does not bruise/bleed easily.  Psychiatric/Behavioral: Negative for dysphoric mood, self-injury, sleep disturbance and suicidal ideas. The patient is not nervous/anxious.        Objective:   Physical Exam  Constitutional: He is oriented to person, place, and time. He appears well-developed and well-nourished. No distress.  HENT:  Head: Normocephalic and atraumatic.  Right Ear: Hearing, tympanic membrane, external ear and ear canal normal. Tympanic membrane is not erythematous and not bulging. No  decreased hearing is noted.  Left Ear: Tympanic membrane, external ear and ear canal normal. Tympanic membrane is not erythematous and not bulging. No decreased hearing is noted.  Nose: Nose normal. Right sinus exhibits no maxillary sinus tenderness and no frontal sinus tenderness. Left sinus exhibits no maxillary sinus tenderness and no frontal sinus tenderness.  Mouth/Throat: Uvula is midline, oropharynx is clear and moist and mucous membranes are normal.  Eyes: Conjunctivae are normal. Pupils are equal, round, and reactive to light.  Neck: Normal range of motion. Neck supple.  Cardiovascular: Normal rate, regular rhythm, normal heart sounds and intact distal pulses.  No murmur heard. Pulmonary/Chest: Effort normal and breath sounds normal. No respiratory distress. He has no wheezes. He has no rales. He exhibits no tenderness.  Abdominal: Soft. Bowel sounds are normal. He exhibits no distension and no mass. There is no tenderness. There is no rebound and no guarding. Hernia confirmed negative in the right inguinal area and confirmed negative in the left inguinal area.  Genitourinary: Rectum normal, prostate normal, testes normal and penis normal. Rectal exam shows guaiac negative stool.  Genitourinary Comments: Chaperone  present during examination.  Musculoskeletal: Normal range of motion. He exhibits no edema or tenderness.  Lymphadenopathy:    He has no cervical adenopathy.  Neurological: He is alert and oriented to person, place, and time. Coordination normal.  Skin: Skin is warm and dry. He is not diaphoretic. No pallor.  Psychiatric: He has a normal mood and affect. His behavior is normal. Judgment and thought content normal.  Nursing note and vitals reviewed.         Assessment & Plan:   1. Need for influenza vaccination   2. Screening for colon cancer   3. Acquired hypothyroidism   4. Benign paroxysmal positional vertigo, unspecified laterality   5. Anxiety state   6. Other  hyperlipidemia   7. Healthcare maintenance   8. Atypical mole     Acquired hypothyroidism Last TSH 2.3 on 07/16/2016 Continue Levothyroxine 82mcg daily Will re-check TSH with annual labs 06/2017  BPPV (benign paroxysmal positional vertigo) Denies any acute sx's since April 2018  Anxiety state Well managed on Sertraline 50mg  daily  Screening for colon cancer Amb referral to GI for colonoscopy placed  Other hyperlipidemia Will check lipids 06/2017   Healthcare maintenance Increase water intake, strive for at least 120 ounces/day.   Follow Heart Healthy diet Please return in 3 months for fasting labs (06/2017) and in 1 year for annual physical (03/2018)  Atypical mole 12/2016 Skin , right upper arm, shave SUPERFICIAL SQUAMOUS CELL CARCINOMA, KERATOACANTHOMA-LIKE PATTERN Will f/u with Derm 06/2017    FOLLOW-UP:  Return in about 3 months (around 06/22/2017) for Fasting Lab Draw.

## 2017-03-22 ENCOUNTER — Ambulatory Visit (INDEPENDENT_AMBULATORY_CARE_PROVIDER_SITE_OTHER): Payer: 59 | Admitting: Adult Health

## 2017-03-22 ENCOUNTER — Encounter: Payer: Self-pay | Admitting: Adult Health

## 2017-03-22 VITALS — BP 107/67 | HR 80 | Ht 69.0 in | Wt 246.3 lb

## 2017-03-22 DIAGNOSIS — R5383 Other fatigue: Secondary | ICD-10-CM | POA: Diagnosis not present

## 2017-03-22 DIAGNOSIS — H8110 Benign paroxysmal vertigo, unspecified ear: Secondary | ICD-10-CM | POA: Diagnosis not present

## 2017-03-22 DIAGNOSIS — Z0000 Encounter for general adult medical examination without abnormal findings: Secondary | ICD-10-CM

## 2017-03-22 DIAGNOSIS — E7849 Other hyperlipidemia: Secondary | ICD-10-CM | POA: Diagnosis not present

## 2017-03-22 DIAGNOSIS — E039 Hypothyroidism, unspecified: Secondary | ICD-10-CM

## 2017-03-22 DIAGNOSIS — D229 Melanocytic nevi, unspecified: Secondary | ICD-10-CM | POA: Diagnosis not present

## 2017-03-22 DIAGNOSIS — Z23 Encounter for immunization: Secondary | ICD-10-CM

## 2017-03-22 DIAGNOSIS — F411 Generalized anxiety disorder: Secondary | ICD-10-CM

## 2017-03-22 DIAGNOSIS — Z1211 Encounter for screening for malignant neoplasm of colon: Secondary | ICD-10-CM | POA: Diagnosis not present

## 2017-03-22 LAB — POC HEMOCCULT BLD/STL (OFFICE/1-CARD/DIAGNOSTIC): Fecal Occult Blood, POC: NEGATIVE

## 2017-03-22 NOTE — Assessment & Plan Note (Signed)
Will check lipids 06/2017

## 2017-03-22 NOTE — Assessment & Plan Note (Signed)
Amb referral to GI for colonoscopy placed

## 2017-03-22 NOTE — Assessment & Plan Note (Signed)
Well managed on Sertraline 50mg  daily

## 2017-03-22 NOTE — Assessment & Plan Note (Signed)
Denies any acute sx's since April 2018

## 2017-03-22 NOTE — Assessment & Plan Note (Signed)
Increase water intake, strive for at least 120 ounces/day.   Follow Heart Healthy diet Please return in 3 months for fasting labs (06/2017) and in 1 year for annual physical (03/2018)

## 2017-03-22 NOTE — Assessment & Plan Note (Signed)
12/2016 Skin , right upper arm, shave SUPERFICIAL SQUAMOUS CELL CARCINOMA, KERATOACANTHOMA-LIKE PATTERN Will f/u with Derm 06/2017

## 2017-03-22 NOTE — Assessment & Plan Note (Signed)
Last TSH 2.3 on 07/16/2016 Continue Levothyroxine 57mcg daily Will re-check TSH with annual labs 06/2017

## 2017-03-22 NOTE — Patient Instructions (Addendum)
Heart-Healthy Eating Plan Many factors influence your heart health, including eating and exercise habits. Heart (coronary) risk increases with abnormal blood fat (lipid) levels. Heart-healthy meal planning includes limiting unhealthy fats, increasing healthy fats, and making other small dietary changes. This includes maintaining a healthy body weight to help keep lipid levels within a normal range. What is my plan? Your health care provider recommends that you:  Get no more than __25__% of the total calories in your daily diet from fat.  Limit your intake of saturated fat to less than __5__% of your total calories each day.  Limit the amount of cholesterol in your diet to less than __300__ mg per day.  What types of fat should I choose?  Choose healthy fats more often. Choose monounsaturated and polyunsaturated fats, such as olive oil and canola oil, flaxseeds, walnuts, almonds, and seeds.  Eat more omega-3 fats. Good choices include salmon, mackerel, sardines, tuna, flaxseed oil, and ground flaxseeds. Aim to eat fish at least two times each week.  Limit saturated fats. Saturated fats are primarily found in animal products, such as meats, butter, and cream. Plant sources of saturated fats include palm oil, palm kernel oil, and coconut oil.  Avoid foods with partially hydrogenated oils in them. These contain trans fats. Examples of foods that contain trans fats are stick margarine, some tub margarines, cookies, crackers, and other baked goods. What general guidelines do I need to follow?  Check food labels carefully to identify foods with trans fats or high amounts of saturated fat.  Fill one half of your plate with vegetables and green salads. Eat 4-5 servings of vegetables per day. A serving of vegetables equals 1 cup of raw leafy vegetables,  cup of raw or cooked cut-up vegetables, or  cup of vegetable juice.  Fill one fourth of your plate with whole grains. Look for the word "whole" as  the first word in the ingredient list.  Fill one fourth of your plate with lean protein foods.  Eat 4-5 servings of fruit per day. A serving of fruit equals one medium whole fruit,  cup of dried fruit,  cup of fresh, frozen, or canned fruit, or  cup of 100% fruit juice.  Eat more foods that contain soluble fiber. Examples of foods that contain this type of fiber are apples, broccoli, carrots, beans, peas, and barley. Aim to get 20-30 g of fiber per day.  Eat more home-cooked food and less restaurant, buffet, and fast food.  Limit or avoid alcohol.  Limit foods that are high in starch and sugar.  Avoid fried foods.  Cook foods by using methods other than frying. Baking, boiling, grilling, and broiling are all great options. Other fat-reducing suggestions include: ? Removing the skin from poultry. ? Removing all visible fats from meats. ? Skimming the fat off of stews, soups, and gravies before serving them. ? Steaming vegetables in water or broth.  Lose weight if you are overweight. Losing just 5-10% of your initial body weight can help your overall health and prevent diseases such as diabetes and heart disease.  Increase your consumption of nuts, legumes, and seeds to 4-5 servings per week. One serving of dried beans or legumes equals  cup after being cooked, one serving of nuts equals 1 ounces, and one serving of seeds equals  ounce or 1 tablespoon.  You may need to monitor your salt (sodium) intake, especially if you have high blood pressure. Talk with your health care provider or dietitian to get  more information about reducing sodium. What foods can I eat? Grains  Breads, including [place], white, pita, wheat, raisin, rye, oatmeal, and [place]. Tortillas that are neither fried nor made with lard or trans fat. Low-fat rolls, including hotdog and hamburger buns and English muffins. Biscuits. Muffins. Waffles. Pancakes. Light popcorn. Whole-grain cereals. Flatbread. Melba toast.  Pretzels. Breadsticks. Rusks. Low-fat snacks and crackers, including oyster, saltine, matzo, graham, animal, and rye. Rice and pasta, including brown rice and those that are made with whole wheat. Vegetables All vegetables. Fruits All fruits, but limit coconut. Meats and Other Protein Sources Lean, well-trimmed beef, veal, pork, and lamb. Chicken and [place] without skin. All fish and shellfish. Wild duck, rabbit, pheasant, and venison. Egg whites or low-cholesterol egg substitutes. Dried beans, peas, lentils, and tofu.Seeds and most nuts. Dairy Low-fat or nonfat cheeses, including ricotta, string, and mozzarella. Skim or 1% milk that is liquid, powdered, or evaporated. Buttermilk that is made with low-fat milk. Nonfat or low-fat yogurt. Beverages Mineral water. Diet carbonated beverages. Sweets and Desserts Sherbets and fruit ices. Honey, jam, marmalade, jelly, and syrups. Meringues and gelatins. Pure sugar candy, such as hard candy, jelly beans, gumdrops, mints, marshmallows, and small amounts of dark chocolate. [employer] Eat all sweets and desserts in moderation. Fats and Oils Nonhydrogenated (trans-free) margarines. Vegetable oils, including soybean, sesame, sunflower, olive, peanut, safflower, corn, canola, and cottonseed. Salad dressings or mayonnaise that are made with a vegetable oil. Limit added fats and oils that you use for cooking, baking, salads, and as spreads. Other Cocoa powder. Coffee and tea. All seasonings and condiments. The items listed above may not be a complete list of recommended foods or beverages. Contact your dietitian for more options. What foods are not recommended? Grains Breads that are made with saturated or trans fats, oils, or whole milk. Croissants. Butter rolls. Cheese breads. Sweet rolls. Donuts. Buttered popcorn. Chow mein noodles. High-fat crackers, such as cheese or butter crackers. Meats and Other Protein Sources Fatty meats, such as hotdogs,  short ribs, sausage, spareribs, bacon, ribeye roast or steak, and mutton. High-fat deli meats, such as salami and bologna. Caviar. Domestic duck and goose. Organ meats, such as kidney, liver, sweetbreads, brains, gizzard, chitterlings, and heart. Dairy Cream, sour cream, cream cheese, and creamed cottage cheese. Whole milk cheeses, including blue (bleu), Monte[place] Jack, [place], [place], [place], [place], Swiss, [place], [place], and [place]. Whole or 2% milk that is liquid, evaporated, or condensed. Whole buttermilk. Cream sauce or high-fat cheese sauce. Yogurt that is made from whole milk. Beverages Regular sodas and drinks with added sugar. Sweets and Desserts Frosting. Pudding. Cookies. Cakes other than angel food cake. Candy that has milk chocolate or white chocolate, hydrogenated fat, butter, coconut, or unknown ingredients. Buttered syrups. Full-fat ice cream or ice cream drinks. Fats and Oils Gravy that has suet, meat fat, or shortening. Cocoa butter, hydrogenated oils, palm oil, coconut oil, palm kernel oil. These can often be found in baked products, candy, fried foods, nondairy creamers, and whipped toppings. Solid fats and shortenings, including bacon fat, salt pork, lard, and butter. Nondairy cream substitutes, such as coffee creamers and sour cream substitutes. Salad dressings that are made of unknown oils, cheese, or sour cream. The items listed above may not be a complete list of foods and beverages to avoid. Contact your dietitian for more information. This information is not intended to replace advice given to you by your health care provider. Make sure you discuss any questions you have with your health care  provider. Document Released: 02/03/2008 Document Revised: 11/14/2015 Document Reviewed: 10/18/2013 Elsevier Interactive Patient Education  2017 [place].  Please keep follow-up with dermatology 06/2017 Increase water intake, strive for at least 120 ounces/day.   Follow Heart  Healthy diet Please return in 3 months for fasting labs (06/2017) and in 1 year for annual physical (03/2018) NICE TO SEE YOU!

## 2017-05-24 ENCOUNTER — Encounter: Payer: Self-pay | Admitting: Adult Health

## 2017-05-31 ENCOUNTER — Other Ambulatory Visit: Payer: Self-pay | Admitting: Adult Health

## 2017-06-28 ENCOUNTER — Other Ambulatory Visit: Payer: 59

## 2017-06-28 DIAGNOSIS — Z0000 Encounter for general adult medical examination without abnormal findings: Secondary | ICD-10-CM

## 2017-06-28 DIAGNOSIS — E7849 Other hyperlipidemia: Secondary | ICD-10-CM

## 2017-06-28 DIAGNOSIS — E039 Hypothyroidism, unspecified: Secondary | ICD-10-CM

## 2017-06-28 DIAGNOSIS — R5383 Other fatigue: Secondary | ICD-10-CM

## 2017-06-29 LAB — COMPREHENSIVE METABOLIC PANEL
ALT: 26 IU/L (ref 0–44)
AST: 18 IU/L (ref 0–40)
Albumin/Globulin Ratio: 1.8 (ref 1.2–2.2)
Albumin: 4.2 g/dL (ref 3.5–5.5)
Alkaline Phosphatase: 58 IU/L (ref 39–117)
BUN/Creatinine Ratio: 17 (ref 9–20)
BUN: 17 mg/dL (ref 6–24)
Bilirubin Total: 0.6 mg/dL (ref 0.0–1.2)
CALCIUM: 9.3 mg/dL (ref 8.7–10.2)
CO2: 23 mmol/L (ref 20–29)
CREATININE: 0.99 mg/dL (ref 0.76–1.27)
Chloride: 105 mmol/L (ref 96–106)
GFR calc Af Amer: 101 mL/min/{1.73_m2} (ref >59)
GFR, EST NON AFRICAN AMERICAN: 87 mL/min/{1.73_m2} (ref >59)
Globulin, Total: 2.3 g/dL (ref 1.5–4.5)
Glucose: 91 mg/dL (ref 65–99)
POTASSIUM: 4.8 mmol/L (ref 3.5–5.2)
Sodium: 141 mmol/L (ref 134–144)
Total Protein: 6.5 g/dL (ref 6.0–8.5)

## 2017-06-29 LAB — LIPID PANEL
CHOL/HDL RATIO: 4.1 ratio (ref 0.0–5.0)
Cholesterol, Total: 140 mg/dL (ref 100–199)
HDL: 34 mg/dL — AB (ref >39)
LDL CALC: 85 mg/dL (ref 0–99)
TRIGLYCERIDES: 103 mg/dL (ref 0–149)
VLDL Cholesterol Cal: 21 mg/dL (ref 5–40)

## 2017-06-29 LAB — TSH: TSH: 4.38 u[IU]/mL (ref 0.450–4.500)

## 2017-06-29 LAB — VITAMIN D 25 HYDROXY (VIT D DEFICIENCY, FRACTURES): VIT D 25 HYDROXY: 38.1 ng/mL (ref 30.0–100.0)

## 2017-08-09 ENCOUNTER — Other Ambulatory Visit: Payer: Self-pay | Admitting: Adult Health

## 2017-08-09 NOTE — Telephone Encounter (Signed)
Reviewed patient's chart.  Patient last seen on 03/22/2017 and was advised to follow up in 3 months (06/22/17) - patient has not been seen for follow up and does not have a future appointment at this time.  Patient should have enough medication and is not due to run out until 09/13/17. Patient uses mail order pharmacy, which is why request sent early.  Will reach out to patient to make a follow up appointment before sending in a 30 day supply to the pharmacy per office policy. MPulliam, CMA/RT(R)

## 2017-08-16 ENCOUNTER — Ambulatory Visit: Payer: 59 | Admitting: Adult Health

## 2017-08-29 ENCOUNTER — Other Ambulatory Visit: Payer: Self-pay | Admitting: Adult Health

## 2017-08-30 ENCOUNTER — Telehealth: Payer: Self-pay | Admitting: Adult Health

## 2017-08-30 NOTE — Telephone Encounter (Signed)
Provider required OV, for any other refills--Cld pt left VM to call office to set up appointment.  --glh

## 2017-08-30 NOTE — Telephone Encounter (Signed)
-----   Message from Fonnie Mu, [place] sent at 08/29/2017 11:52 AM EDT ----- Please call pt to schedule f/u prior to any further refills

## 2017-09-07 ENCOUNTER — Ambulatory Visit (INDEPENDENT_AMBULATORY_CARE_PROVIDER_SITE_OTHER): Payer: 59 | Admitting: Adult Health

## 2017-09-07 ENCOUNTER — Encounter: Payer: Self-pay | Admitting: Adult Health

## 2017-09-07 ENCOUNTER — Ambulatory Visit: Payer: 59

## 2017-09-07 VITALS — BP 114/74 | HR 89 | Temp 98.4°F | Ht 69.0 in | Wt 250.6 lb

## 2017-09-07 DIAGNOSIS — R059 Cough, unspecified: Secondary | ICD-10-CM

## 2017-09-07 DIAGNOSIS — R062 Wheezing: Secondary | ICD-10-CM

## 2017-09-07 DIAGNOSIS — R05 Cough: Secondary | ICD-10-CM

## 2017-09-07 DIAGNOSIS — Z0000 Encounter for general adult medical examination without abnormal findings: Secondary | ICD-10-CM

## 2017-09-07 MED ORDER — SERTRALINE HCL 100 MG PO TABS: 50.0000 mg | ORAL_TABLET | Freq: Every day | ORAL | 2 refills | Status: DC

## 2017-09-07 MED ORDER — ROSUVASTATIN CALCIUM 40 MG PO TABS: 40.0000 mg | ORAL_TABLET | Freq: Every day | ORAL | 2 refills | Status: DC

## 2017-09-07 MED ORDER — MONTELUKAST SODIUM 10 MG PO TABS: 10.0000 mg | ORAL_TABLET | Freq: Every day | ORAL | 1 refills | Status: DC

## 2017-09-07 MED ORDER — LEVOTHYROXINE SODIUM 75 MCG PO TABS: 75.0000 ug | ORAL_TABLET | Freq: Every day | ORAL | 2 refills | Status: DC

## 2017-09-07 MED ORDER — ALBUTEROL SULFATE HFA 108 (90 BASE) MCG/ACT IN AERS: 2.0000 | INHALATION_SPRAY | Freq: Four times a day (QID) | RESPIRATORY_TRACT | 3 refills | Status: DC | PRN

## 2017-09-07 NOTE — Assessment & Plan Note (Addendum)
Follow-up 6 months- complete physical with fasting labs.

## 2017-09-07 NOTE — Patient Instructions (Addendum)
Cough, Adult Coughing is a reflex that clears your throat and your airways. Coughing helps to heal and protect your lungs. It is normal to cough occasionally, but a cough that happens with other symptoms or lasts a long time may be a sign of a condition that needs treatment. A cough may last only 2-3 weeks (acute), or it may last longer than 8 weeks (chronic). What are the causes? Coughing is commonly caused by:  Breathing in substances that irritate your lungs.  A viral or bacterial respiratory infection.  Allergies.  Asthma.  Postnasal drip.  Smoking.  Acid backing up from the stomach into the esophagus (gastroesophageal reflux).  Certain medicines.  Chronic lung problems, including COPD (or rarely, lung cancer).  Other medical conditions such as heart failure.  Follow these instructions at home: Pay attention to any changes in your symptoms. Take these actions to help with your discomfort:  Take medicines only as told by your health care provider. ? If you were prescribed an antibiotic medicine, take it as told by your health care provider. Do not stop taking the antibiotic even if you start to feel better. ? Talk with your health care provider before you take a cough suppressant medicine.  Drink enough fluid to keep your urine clear or pale yellow.  If the air is dry, use a cold steam vaporizer or humidifier in your bedroom or your home to help loosen secretions.  Avoid anything that causes you to cough at work or at home.  If your cough is worse at night, try sleeping in a semi-upright position.  Avoid cigarette smoke. If you smoke, quit smoking. If you need help quitting, ask your health care provider.  Avoid caffeine.  Avoid alcohol.  Rest as needed.  Contact a health care provider if:  You have new symptoms.  You cough up pus.  Your cough does not get better after 2-3 weeks, or your cough gets worse.  You cannot control your cough with suppressant  medicines and you are losing sleep.  You develop pain that is getting worse or pain that is not controlled with pain medicines.  You have a fever.  You have unexplained weight loss.  You have night sweats. Get help right away if:  You cough up blood.  You have difficulty breathing.  Your heartbeat is very fast. This information is not intended to replace advice given to you by your health care provider. Make sure you discuss any questions you have with your health care provider. Document Released: 10/23/2010 Document Revised: 10/02/2015 Document Reviewed: 07/03/2014 Elsevier Interactive Patient Education  2018 [employer].   Bronchospasm, Adult Bronchospasm is a tightening of the airways going into the lungs. During an episode, it may be harder to breathe. You may cough, and you may make a whistling sound when you breathe (wheeze). This condition often affects people with asthma. What are the causes? This condition is caused by swelling and irritation in the airways. It can be triggered by:  An infection (common).  Seasonal allergies.  An allergic reaction.  Exercise.  Irritants. These include pollution, cigarette smoke, strong odors, aerosol sprays, and paint fumes.  Weather changes. Winds increase molds and pollens in the air. Cold air may cause swelling.  Stress and emotional upset.  What are the signs or symptoms? Symptoms of this condition include:  Wheezing. If the episode was triggered by an allergy, wheezing may start right away or hours later.  Nighttime coughing.  Frequent or severe coughing with  a simple cold.  Chest tightness.  Shortness of breath.  Decreased ability to exercise.  How is this diagnosed? This condition is usually diagnosed with a review of your medical history and a physical exam. Tests, such as lung function tests, are sometimes done to look for other conditions. The need for a chest X-ray depends on where the wheezing occurs and  whether it is the first time you have wheezed. How is this treated? This condition may be treated with:  Inhaled medicines. These open up the airways and help you breathe. They can be taken with an inhaler or a nebulizer device.  Corticosteroid medicines. These may be given for severe bronchospasm, usually when it is associated with asthma.  Avoiding triggers, such as irritants, infection, or allergies.  Follow these instructions at home: Medicines  Take over-the-counter and prescription medicines only as told by your health care provider.  If you need to use an inhaler or nebulizer to take your medicine, ask your health care provider to explain how to use it correctly. If you were given a spacer, always use it with your inhaler. Lifestyle  Reduce the number of triggers in your home. To do this: ? Change your heating and air conditioning filter at least once a month. ? Limit your use of fireplaces and wood stoves. ? Do not smoke. Do not allow smoking in your home. ? Avoid using perfumes and fragrances. ? Get rid of pests, such as roaches and mice, and their droppings. ? Remove any mold from your home. ? Keep your house clean and dust free. Use unscented cleaning products. ? Replace carpet with wood, tile, or vinyl flooring. Carpet can trap dander and dust. ? Use allergy-proof pillows, mattress covers, and box spring covers. ? Wash bed sheets and blankets every week in hot water. Dry them in a dryer. ? Use blankets that are made of polyester or cotton. ? Wash your hands often. ? Do not allow pets in your bedroom.  Avoid breathing in cold air when you exercise. General instructions  Have a plan for seeking medical care. Know when to call your health care provider and local emergency services, and where to get emergency care.  Stay up to date on your immunizations.  When you have an episode of bronchospasm, stay calm. Try to relax and breathe more slowly.  If you have asthma,  make sure you have an asthma action plan.  Keep all follow-up visits as told by your health care provider. This is important. Contact a health care provider if:  You have muscle aches.  You have chest pain.  The mucus that you cough up (sputum) changes from clear or white to yellow, green, gray, or bloody.  You have a fever.  Your sputum gets thicker. Get help right away if:  Your wheezing and coughing get worse, even after you take your prescribed medicines.  It gets even harder to breathe.  You develop severe chest pain. Summary  Bronchospasm is a tightening of the airways going into the lungs.  During an episode of bronchospasm, you may have a harder time breathing. You may cough and make a whistling sound when you breathe (wheeze).  Avoid exposure to triggers such as smoke, dust, mold, animal dander, and fragrances.  When you have an episode of bronchospasm, stay calm. Try to relax and breathe more slowly. This information is not intended to replace advice given to you by your health care provider. Make sure you discuss any questions you have  with your health care provider. Document Released: 04/29/2003 Document Revised: 04/22/2016 Document Reviewed: 04/22/2016 Elsevier Interactive Patient Education  2017 [place].  Please start Montelukast (Singular) 10mg  once daily. Please use inhalers as needed. Increase water intake and reduce your exposure to known allergens. Referral to Pulmonology placed. Follow-up 6 months- complete physical with fasting labs. FEEL BETTER!

## 2017-09-07 NOTE — Progress Notes (Addendum)
Subjective:    Patient ID: Elijah Small, male    DOB: [birth year], 52 y.o.   MRN: [id number]   HPI:  M r. Elijah Small presents with persistent non-productive cough and post nasal gtt that has been ongoing >3 years. He had allergy testing completed 2017 and was + for dog allergy.  He has three indoor/outdoor dogs at home. He also reports intermittent wheezing when exposed to temperature change and exercise. His children have asthma He reports intermittent clear nasal drainage. He uses chewing tobacco >35 years, he declined smoking cessation today. He denies fever/night sweats/N/V/D/CP/palpitations/unexplainedwt loss. He denies dizziness/HA. He has tried OTC Allegra, OTC Zyrtec, and OTC Claritin without sx relief. He reports that his GERD is well controlled and he avoids spicy/acidicfood and does not eat close to bedtime.  Patient Care Team    Relationship Specialty Notifications Start End  Esaw Grandchild, NP PCP - General Family Medicine  12/10/16     Patient Active Problem List   Diagnosis Date Noted  . Cough in adult 09/07/2017  . Wheezing 09/07/2017  . Screening for colon cancer 03/22/2017  . Healthcare maintenance 03/22/2017  . Atypical mole 12/14/2016  . BPPV (benign paroxysmal positional vertigo) 12/14/2016  . Other fatigue 06/10/2016  . Family history of diabetes mellitus 06/10/2016  . Acquired hypothyroidism 06/10/2016  . Need for influenza vaccination 06/10/2016  . Obesity, Class II, BMI 35-39.9 06/10/2016  . Need for prophylactic vaccination with combined diphtheria-tetanus-pertussis (DTP) vaccine 08/06/2013  . Anxiety state 02/04/2013  . Other hyperlipidemia 02/04/2013  . GERD (gastroesophageal reflux disease)   . Diverticulitis   . Sleep apnea   . History of colon polyps      Past Medical History:  Diagnosis Date  . Anxiety   . Diverticulitis   . GERD (gastroesophageal reflux disease)   . History of colon polyps   . Hypercholesteremia   . Sleep  apnea   . Thyroid disease      Past Surgical History:  Procedure Laterality Date  . COLECTOMY     Dr Margot Chimes - 12 inches  . KNEE SURGERY Left      Family History  Problem Relation Age of Onset  . Hypertension Father   . Heart disease Father        He died at 57 from MI   . Stroke Mother   . Dementia Mother   . Cancer Brother        thyroid  . Diabetes Brother   . Hypertension Brother      Social History   Substance and Sexual Activity  Drug Use No     Social History   Substance and Sexual Activity  Alcohol Use No     Social History   Tobacco Use  Smoking Status Never Smoker  Smokeless Tobacco Current User  . Types: Snuff     Outpatient Encounter Medications as of 09/07/2017  Medication Sig  . Cholecalciferol (VITAMIN D3) 2000 units TABS Take 1 tablet by mouth daily.  Marland Kitchen levothyroxine (SYNTHROID) 75 MCG tablet Take 1 tablet (75 mcg total) by mouth daily. PATIENT MUST HAVE OFFICE VISIT PRIOR TO ANY FURTHER REFILLS  . rosuvastatin (CRESTOR) 40 MG tablet Take 1 tablet (40 mg total) by mouth daily.  . sertraline (ZOLOFT) 100 MG tablet Take 0.5 tablets (50 mg total) by mouth daily.  . [DISCONTINUED] levothyroxine (SYNTHROID) 75 MCG tablet Take 1 tablet (75 mcg total) by mouth daily. PATIENT MUST HAVE OFFICE VISIT PRIOR TO ANY FURTHER REFILLS  . [  DISCONTINUED] rosuvastatin (CRESTOR) 40 MG tablet Take 1 tablet (40 mg total) by mouth daily.  . [DISCONTINUED] sertraline (ZOLOFT) 100 MG tablet Take 0.5 tablets (50 mg total) by mouth daily.  Marland Kitchen albuterol (PROVENTIL HFA;VENTOLIN HFA) 108 (90 Base) MCG/ACT inhaler Inhale 2 puffs into the lungs every 6 (six) hours as needed for wheezing or shortness of breath.  . montelukast (SINGULAIR) 10 MG tablet Take 1 tablet (10 mg total) by mouth at bedtime.   No facility-administered encounter medications on file as of 09/07/2017.    Allergies: Patient has no known allergies.  Body mass index is 37.01 kg/m.  Blood pressure  114/74, pulse 89, temperature 98.4 F (36.9 C), temperature source Oral, height 5\' 9"  (1.753 m), weight 250 lb 9.6 oz (113.7 kg), SpO2 96 %.  Review of Systems  Constitutional: Positive for fatigue. Negative for activity change, appetite change, chills, diaphoresis, fever and unexpected weight change.  HENT: Positive for congestion, postnasal drip and sinus pressure. Negative for rhinorrhea, sinus pain, sneezing, sore throat, trouble swallowing and voice change.   Eyes: Negative for visual disturbance.  Respiratory: Positive for chest tightness and wheezing. Negative for cough, shortness of breath and stridor.   Cardiovascular: Negative for chest pain, palpitations and leg swelling.  Gastrointestinal: Negative for abdominal distention, abdominal pain, blood in stool, constipation, diarrhea, nausea and vomiting.  Endocrine: Negative for cold intolerance, heat intolerance, polydipsia, polyphagia and polyuria.  Genitourinary: Negative for difficulty urinating and flank pain.  Neurological: Negative for dizziness and headaches.  Hematological: Does not bruise/bleed easily.       Objective:   Physical Exam  Constitutional: He is oriented to person, place, and time. He appears well-developed and well-nourished. No distress.  HENT:  Head: Normocephalic and atraumatic.  Right Ear: External ear normal. Tympanic membrane is not erythematous and not bulging. No decreased hearing is noted.  Left Ear: External ear normal. Tympanic membrane is not erythematous and not bulging. No decreased hearing is noted.  Nose: Rhinorrhea present. No mucosal edema. Right sinus exhibits no maxillary sinus tenderness and no frontal sinus tenderness. Left sinus exhibits no maxillary sinus tenderness and no frontal sinus tenderness.  Mouth/Throat: Uvula is midline, oropharynx is clear and moist and mucous membranes are normal. No oropharyngeal exudate, posterior oropharyngeal edema, posterior oropharyngeal erythema or  tonsillar abscesses.  Eyes: Pupils are equal, round, and reactive to light. Conjunctivae and EOM are normal.  Cardiovascular: Normal rate, regular rhythm, normal heart sounds and intact distal pulses.  No murmur heard. Pulmonary/Chest: Effort normal. No stridor. No respiratory distress. He has wheezes. He has no rales. He exhibits no tenderness.  Neurological: He is alert and oriented to person, place, and time.  Skin: Skin is warm and dry. No rash noted. He is not diaphoretic. No erythema. No pallor.  Psychiatric: He has a normal mood and affect. His behavior is normal. Judgment and thought content normal.  Nursing note and vitals reviewed.         Assessment & Plan:   1. Cough in adult   2. Wheezing   3. Healthcare maintenance     Cough in adult CXR IMPRESSION: No active cardiopulmonary disease. Please start Montelukast (Singular) 10mg  once daily. Please use inhalers as needed. Increase water intake and reduce your exposure to known allergens. Referral to Pulmonology placed.   Wheezing Please start Montelukast (Singular) 10mg  once daily. Please use inhalers as needed. Increase water intake and reduce your exposure to known allergens. Referral to Pulmonology placed.   Healthcare maintenance Follow-up  6 months- complete physical with fasting labs.  FOLLOW-UP:  Return in about 6 months (around 03/10/2018) for CPE, Fasting Labs.

## 2017-09-07 NOTE — Assessment & Plan Note (Addendum)
CXR IMPRESSION: No active cardiopulmonary disease. Please start Montelukast (Singular) 10mg  once daily. Please use inhalers as needed. Increase water intake and reduce your exposure to known allergens. Referral to Pulmonology placed.

## 2017-09-07 NOTE — Assessment & Plan Note (Signed)
Please start Montelukast (Singular) 10mg  once daily. Please use inhalers as needed. Increase water intake and reduce your exposure to known allergens. Referral to Pulmonology placed.

## 2017-09-09 ENCOUNTER — Telehealth: Payer: Self-pay | Admitting: Adult Health

## 2017-09-09 NOTE — Telephone Encounter (Signed)
Patient wants to discuss xray results

## 2017-09-09 NOTE — Telephone Encounter (Signed)
Pt informed of results.  Pt expressed understanding.  T. Reginald Weida, CMA  

## 2017-09-21 ENCOUNTER — Other Ambulatory Visit: Payer: Self-pay | Admitting: Adult Health

## 2017-09-22 ENCOUNTER — Encounter: Payer: Self-pay | Admitting: Internal Medicine

## 2017-09-22 ENCOUNTER — Ambulatory Visit: Payer: 59 | Admitting: Internal Medicine

## 2017-09-22 VITALS — BP 112/80 | HR 78 | Ht 69.0 in | Wt 247.0 lb

## 2017-09-22 DIAGNOSIS — J45991 Cough variant asthma: Secondary | ICD-10-CM | POA: Diagnosis not present

## 2017-09-22 LAB — NITRIC OXIDE: Nitric Oxide: 76

## 2017-09-22 MED ORDER — BUDESONIDE-FORMOTEROL FUMARATE 160-4.5 MCG/ACT IN AERO: INHALATION_SPRAY | RESPIRATORY_TRACT | 12 refills | Status: DC

## 2017-09-22 MED ORDER — BUDESONIDE-FORMOTEROL FUMARATE 160-4.5 MCG/ACT IN AERO: 2.0000 | INHALATION_SPRAY | Freq: Two times a day (BID) | RESPIRATORY_TRACT | 0 refills | Status: DC

## 2017-09-22 NOTE — Progress Notes (Signed)
Subjective:     Patient ID: Elijah Small, male   DOB: 19-May-1965,    MRN: [id number]  HPI  33 yowm never smoker grew up in [facility], healthy childhood, played sports moved to [place] and fine until around 2016-7 onset urge to clear throat /cough sensation of nasal  drainage and obst  Along with chest tightness / subj wheezing when outdoor assoc with with dog exp worse symptoms in winter and resolves at the beach and added singulair early May 2019 ?helps and prn saba which in past (from children) helps> referred to pulmonary clinic 09/22/2017 by Meredith Leeds   09/22/2017 [facility] office visit/ Stacye Noori   Chief Complaint  Patient presents with  . Pulmonary Consult    Referred by Mina Marble, NP. Pt c/o cough x 2 yrs. He also c/o wheezing and SOB "when I work outside when the weather is cool".  He states that his cough is prod with clear sputum- worse when he is around dogs.  He uses his albuterol inhaler once per wk on average.  sleeps fine on cpap but in am lots of cough / congestion   Symptoms Worse with push mowing esp at end and even p stops >> chest tightness /wheeze s nausea or vomiting or diaph and always better p saba when uses  No obvious day to day or daytime variability or assoc  purulent sputum or mucus plugs or hemoptysis or cp or  or overt   hb symptoms. No unusual exposure hx or h/o childhood pna/ asthma or knowledge of premature birth.  Sleeping  Ok on cpap   without nocturnal  or early am exacerbation  of respiratory  c/o's or need for noct saba. Also denies any obvious fluctuation of symptoms with weather or environmental changes or other aggravating or alleviating factors except as outlined above   Current Allergies, Complete Past Medical History, Past Surgical History, Family History, and Social History were reviewed in [employer] record.  ROS  The following are not active complaints unless bolded Hoarseness, sore throat, dysphagia, dental  problems, itching, sneezing,  nasal congestion or discharge of excess mucus or purulent secretions, ear ache,   fever, chills, sweats, unintended wt loss or wt gain, classically pleuritic or exertional cp,  orthopnea pnd or arm/hand swelling  or leg swelling, presyncope, palpitations, abdominal pain, anorexia, nausea, vomiting, diarrhea  or change in bowel habits or change in bladder habits, change in stools or change in urine, dysuria, hematuria,  rash, arthralgias, visual complaints, headache, numbness, weakness or ataxia or problems with walking or coordination,  change in mood or  memory.        Current Meds  Medication Sig  . albuterol (PROVENTIL HFA;VENTOLIN HFA) 108 (90 Base) MCG/ACT inhaler Inhale 2 puffs into the lungs every 6 (six) hours as needed for wheezing or shortness of breath.  . Cholecalciferol (VITAMIN D3) 2000 units TABS Take 1 tablet by mouth daily.  Marland Kitchen levothyroxine (SYNTHROID) 75 MCG tablet Take 1 tablet (75 mcg total) by mouth daily. PATIENT MUST HAVE OFFICE VISIT PRIOR TO ANY FURTHER REFILLS  . montelukast (SINGULAIR) 10 MG tablet Take 1 tablet (10 mg total) by mouth at bedtime.  . rosuvastatin (CRESTOR) 40 MG tablet Take 1 tablet (40 mg total) by mouth daily.  . sertraline (ZOLOFT) 100 MG tablet Take 0.5 tablets (50 mg total) by mouth daily.               Review of Systems  Objective:   Physical Exam  Pleasant mod obese wm nad  Wt Readings from Last 3 Encounters:  09/22/17 247 lb (112 kg)  09/07/17 250 lb 9.6 oz (113.7 kg)  03/22/17 246 lb 4.8 oz (111.7 kg)    Vital signs reviewed - Note on arrival 02 sats  96% on RA        HEENT: nl dentition, and oropharynx. Nl external ear canals without cough reflex Modified Mallampati Score =   3/ moderate bilateral non-specific turbinate edema    NECK :  without JVD/Nodes/TM/ nl carotid upstrokes bilaterally   LUNGS: no acc muscle use,  Nl contour chest which is clear to A and P bilaterally without cough  on insp or exp maneuvers   CV:  RRR  no s3 or murmur or increase in P2, and no edema   ABD:  soft and nontender with nl inspiratory excursion in the supine position. No bruits or organomegaly appreciated, bowel sounds nl  MS:  Nl gait/ ext warm without deformities, calf tenderness, cyanosis or clubbing No obvious joint restrictions   SKIN: warm and dry without lesions    NEURO:  alert, approp, nl sensorium with  no motor or cerebellar deficits apparent.         I personally reviewed images and agree with radiology impression as follows:  CXR:   09/09/17 No active cardiopulmonary disease.     Assessment:

## 2017-09-22 NOTE — Patient Instructions (Addendum)
symbicort 160 Take up to 2 puffs first thing in am and then another 2 puffs about 12 hours later - take full dose until you are 100% better then see if don't do as well with just am dosing    Work on inhaler technique:  relax and gently blow all the way out then take a nice smooth deep breath back in, triggering the inhaler at same time you start breathing in.  Hold for up to 5 seconds if you can. Blow out thru nose. Rinse and gargle with water when done      Only use your albuterol (ventolin) as a rescue medication to be used if you can't catch your breath by resting or doing a relaxed purse lip breathing pattern.  - The less you use it, the better it will work when you need it. - Ok to use up to 2 puffs  every 4 hours if you must but call for immediate appointment if use goes up over your usual need - Don't leave home without it !!  (think of it like the spare tire for your car)   Stay on singulair for now   Please schedule a follow up office visit in 6 weeks, call sooner if needed with PFTs off symbicort that day

## 2017-09-23 ENCOUNTER — Encounter: Payer: Self-pay | Admitting: Internal Medicine

## 2017-09-23 NOTE — Assessment & Plan Note (Addendum)
FENO 09/22/2017  =   76 on singulair - 09/22/2017  After extensive coaching inhaler device  effectiveness =    90% add symbicort 160 2bid   Not clear singulair helping but probably too soon to tell -  The feno def suggests eos airways inflammation and hx suggests allergic rhinitis/ allergy to dogs so rec avoidance for now, esp in bedroom and f/u with in 6 weeks without symbicort that day for baseline before and after spirometry and consider step down then depending on how he is doing and consider allergy testing next depending on response to empirical broad  rx.   Total time devoted to counseling  > 50 % of initial 60 min office visit:  review case with pt/ discussion of options/alternatives/ personally creating written customized instructions  in presence of pt  then going over those specific  Instructions directly with the pt including how to use all of the meds but in particular covering each new medication in detail and the difference between the maintenance= "automatic" meds and the prns using an action plan format for the latter (If this problem/symptom => do that organization reading Left to right).  Please see AVS from this visit for a full list of these instructions which I personally wrote for this pt and  are unique to this visit.   See device teaching which extended face to face time for this visit

## 2017-10-27 ENCOUNTER — Other Ambulatory Visit: Payer: Self-pay | Admitting: Adult Health

## 2017-11-01 ENCOUNTER — Ambulatory Visit: Payer: 59 | Admitting: Internal Medicine

## 2017-12-02 ENCOUNTER — Ambulatory Visit: Payer: 59 | Admitting: Internal Medicine

## 2018-02-28 ENCOUNTER — Ambulatory Visit (INDEPENDENT_AMBULATORY_CARE_PROVIDER_SITE_OTHER): Payer: 59

## 2018-02-28 VITALS — BP 100/66 | HR 76

## 2018-02-28 DIAGNOSIS — Z23 Encounter for immunization: Secondary | ICD-10-CM

## 2018-02-28 NOTE — Progress Notes (Signed)
Pt here for influenza vaccine.  Screening questionnaire reviewed, VIS provided to patient, and any/all patient questions answered.  T. Beverly Suriano, CMA  

## 2018-03-02 ENCOUNTER — Other Ambulatory Visit: Payer: Self-pay

## 2018-03-08 ENCOUNTER — Other Ambulatory Visit: Payer: 59

## 2018-03-08 ENCOUNTER — Other Ambulatory Visit: Payer: Self-pay

## 2018-03-08 DIAGNOSIS — E039 Hypothyroidism, unspecified: Secondary | ICD-10-CM

## 2018-03-08 DIAGNOSIS — Z0000 Encounter for general adult medical examination without abnormal findings: Secondary | ICD-10-CM

## 2018-03-08 DIAGNOSIS — E7849 Other hyperlipidemia: Secondary | ICD-10-CM

## 2018-03-13 NOTE — Progress Notes (Deleted)
Subjective:    Patient ID: Elijah Small, male    DOB: January 02, 1966, 52 y.o.   MRN: [id number]  HPI:  Mr. Roarty is her for CPE  Healthcare Maintenance: Colonoscopy- Immunizations-  Patient Care Team    Relationship Specialty Notifications Start End  Esaw Grandchild, NP PCP - General Family Medicine  12/10/16     Patient Active Problem List   Diagnosis Date Noted  . Cough variant asthma 09/22/2017  . Cough in adult 09/07/2017  . Wheezing 09/07/2017  . Screening for colon cancer 03/22/2017  . Healthcare maintenance 03/22/2017  . Atypical mole 12/14/2016  . BPPV (benign paroxysmal positional vertigo) 12/14/2016  . Other fatigue 06/10/2016  . Family history of diabetes mellitus 06/10/2016  . Acquired hypothyroidism 06/10/2016  . Need for influenza vaccination 06/10/2016  . Obesity, Class II, BMI 35-39.9 06/10/2016  . Need for prophylactic vaccination with combined diphtheria-tetanus-pertussis (DTP) vaccine 08/06/2013  . Anxiety state 02/04/2013  . Other hyperlipidemia 02/04/2013  . GERD (gastroesophageal reflux disease)   . Diverticulitis   . Sleep apnea   . History of colon polyps      Past Medical History:  Diagnosis Date  . Anxiety   . Diverticulitis   . GERD (gastroesophageal reflux disease)   . History of colon polyps   . Hypercholesteremia   . Sleep apnea   . Thyroid disease      Past Surgical History:  Procedure Laterality Date  . COLECTOMY     Dr Margot Chimes - 12 inches  . KNEE SURGERY Left      Family History  Problem Relation Age of Onset  . Hypertension Father   . Heart disease Father        He died at 14 from MI   . Stroke Mother   . Dementia Mother   . Cancer Brother        thyroid  . Diabetes Brother   . Hypertension Brother      Social History   Substance and Sexual Activity  Drug Use No     Social History   Substance and Sexual Activity  Alcohol Use No     Social History   Tobacco Use  Smoking Status Never  Smoker  Smokeless Tobacco Current User  . Types: Snuff     Outpatient Encounter Medications as of 03/15/2018  Medication Sig  . albuterol (PROVENTIL HFA;VENTOLIN HFA) 108 (90 Base) MCG/ACT inhaler Inhale 2 puffs into the lungs every 6 (six) hours as needed for wheezing or shortness of breath.  . budesonide-formoterol (SYMBICORT) 160-4.5 MCG/ACT inhaler Take 2 puffs first thing in am and then another 2 puffs about 12 hours later.  . Cholecalciferol (VITAMIN D3) 2000 units TABS Take 1 tablet by mouth daily.  . montelukast (SINGULAIR) 10 MG tablet Take 1 tablet (10 mg total) by mouth at bedtime.  . rosuvastatin (CRESTOR) 40 MG tablet Take 1 tablet (40 mg total) by mouth daily.  . sertraline (ZOLOFT) 100 MG tablet Take 0.5 tablets (50 mg total) by mouth daily.  Marland Kitchen SYNTHROID 75 MCG tablet TAKE 1 TABLET DAILY BEFORE BREAKFAST   No facility-administered encounter medications on file as of 03/15/2018.     Allergies: Other  There is no height or weight on file to calculate BMI.  There were no vitals taken for this visit.     Review of Systems     Objective:   Physical Exam        Assessment & Plan:  No diagnosis found.  No problem-specific Assessment & Plan notes found for this encounter.    FOLLOW-UP:  No follow-ups on file.

## 2018-03-15 ENCOUNTER — Encounter: Payer: 59 | Admitting: Adult Health

## 2018-03-15 ENCOUNTER — Telehealth: Payer: Self-pay | Admitting: Adult Health

## 2018-03-15 NOTE — Telephone Encounter (Signed)
Left patient message regarding missed CPE , ask to contact office for rescheduling.  --glh

## 2018-04-16 ENCOUNTER — Other Ambulatory Visit: Payer: Self-pay | Admitting: Adult Health

## 2018-05-04 NOTE — Progress Notes (Signed)
Subjective:    Patient ID: Elijah Small, male    DOB: July 13, 1965, 52 y.o.   MRN: [id number]  HPI:  Elijah Small is here for CPE He recently joined [employer] and plans on starting a weekly program with his wife- 3 times per week He has been indulging in "lots of holiday food the last month and a half", 8 lb wt gain since last OV in May 2019 He estimates to drink only 20 -30 oz plain per day He denies acute complaints today  Fasting labs ordered for 06/2018  Healthcare Maintenance: Colonoscopy-completed at [facility]- tracking report down Immunizations-zoster ordered    Patient Care Team    Relationship Specialty Notifications Start End  Adams, Berna Spare, NP PCP - General Family Medicine  12/10/16     Patient Active Problem List   Diagnosis Date Noted  . Family history of prostate cancer in father 05/08/2018  . Cough variant asthma 09/22/2017  . Cough in adult 09/07/2017  . Wheezing 09/07/2017  . Screening for colon cancer 03/22/2017  . Healthcare maintenance 03/22/2017  . Atypical mole 12/14/2016  . BPPV (benign paroxysmal positional vertigo) 12/14/2016  . Other fatigue 06/10/2016  . Family history of diabetes mellitus 06/10/2016  . Acquired hypothyroidism 06/10/2016  . Need for influenza vaccination 06/10/2016  . Obesity, Class II, BMI 35-39.9 06/10/2016  . Need for prophylactic vaccination with combined diphtheria-tetanus-pertussis (DTP) vaccine 08/06/2013  . Anxiety state 02/04/2013  . Other hyperlipidemia 02/04/2013  . GERD (gastroesophageal reflux disease)   . Diverticulitis   . Sleep apnea   . History of colon polyps      Past Medical History:  Diagnosis Date  . Anxiety   . Diverticulitis   . GERD (gastroesophageal reflux disease)   . History of colon polyps   . Hypercholesteremia   . Sleep apnea   . Thyroid disease      Past Surgical History:  Procedure Laterality Date  . COLECTOMY     Dr Margot Chimes - 12 inches  . KNEE SURGERY Left       Family History  Problem Relation Age of Onset  . Hypertension Father   . Heart disease Father        He died at 61 from MI   . Stroke Mother   . Dementia Mother   . Cancer Brother        thyroid  . Diabetes Brother   . Hypertension Brother      Social History   Substance and Sexual Activity  Drug Use No     Social History   Substance and Sexual Activity  Alcohol Use No     Social History   Tobacco Use  Smoking Status Never Smoker  Smokeless Tobacco Current User  . Types: Snuff     Outpatient Encounter Medications as of 05/08/2018  Medication Sig  . albuterol (PROVENTIL HFA;VENTOLIN HFA) 108 (90 Base) MCG/ACT inhaler Inhale 2 puffs into the lungs every 6 (six) hours as needed for wheezing or shortness of breath.  . budesonide-formoterol (SYMBICORT) 160-4.5 MCG/ACT inhaler Take 2 puffs first thing in am and then another 2 puffs about 12 hours later.  . Cholecalciferol (VITAMIN D3) 2000 units TABS Take 1 tablet by mouth daily.  . rosuvastatin (CRESTOR) 40 MG tablet Take 1 tablet (40 mg total) by mouth daily.  . sertraline (ZOLOFT) 100 MG tablet Take 0.5 tablets (50 mg total) by mouth daily.  Marland Kitchen SYNTHROID 75 MCG tablet TAKE 1 TABLET DAILY BEFORE BREAKFAST  . [  DISCONTINUED] montelukast (SINGULAIR) 10 MG tablet Take 1 tablet (10 mg total) by mouth at bedtime.   No facility-administered encounter medications on file as of 05/08/2018.     Allergies: Other  Body mass index is 38.55 kg/m.  Blood pressure 106/66, pulse 84, temperature 98.4 F (36.9 C), temperature source Oral, height 5' 8.25" (1.734 m), weight 255 lb 6.4 oz (115.8 kg), SpO2 95 %.     Review of Systems  Constitutional: Positive for fatigue. Negative for activity change, appetite change, chills, diaphoresis, fever and unexpected weight change.  HENT: Negative for congestion.   Eyes: Negative for visual disturbance.  Respiratory: Negative for cough, chest tightness, shortness of breath,  wheezing and stridor.   Cardiovascular: Negative for chest pain, palpitations and leg swelling.  Gastrointestinal: Negative for abdominal distention, abdominal pain, blood in stool, constipation, diarrhea, nausea and vomiting.  Endocrine: Negative for cold intolerance, heat intolerance, polydipsia, polyphagia and polyuria.  Genitourinary: Negative for difficulty urinating and flank pain.  Musculoskeletal: Negative for arthralgias, back pain, gait problem, joint swelling, myalgias, neck pain and neck stiffness.  Skin: Negative for color change, pallor, rash and wound.  Neurological: Negative for dizziness and headaches.  Hematological: Does not bruise/bleed easily.  Psychiatric/Behavioral: Negative for agitation, behavioral problems, confusion, decreased concentration, dysphoric mood, hallucinations, self-injury, sleep disturbance and suicidal ideas. The patient is not nervous/anxious and is not hyperactive.        Objective:   Physical Exam Vitals signs and nursing note reviewed. Exam conducted with a chaperone present.  Constitutional:      General: He is not in acute distress.    Appearance: He is obese. He is not ill-appearing, toxic-appearing or diaphoretic.  HENT:     Head: Normocephalic and atraumatic.     Right Ear: Tympanic membrane, ear canal and external ear normal.     Left Ear: Tympanic membrane, ear canal and external ear normal.     Nose: Nose normal.     Mouth/Throat:     Mouth: Mucous membranes are moist.     Pharynx: No oropharyngeal exudate or posterior oropharyngeal erythema.  Eyes:     General: No scleral icterus.       Right eye: No discharge.        Left eye: No discharge.     Extraocular Movements: Extraocular movements intact.     Conjunctiva/sclera: Conjunctivae normal.     Pupils: Pupils are equal, round, and reactive to light.  Neck:     Musculoskeletal: Normal range of motion.  Cardiovascular:     Rate and Rhythm: Normal rate.     Pulses: Normal  pulses.     Heart sounds: Normal heart sounds. No murmur. No friction rub. No gallop.   Pulmonary:     Effort: Pulmonary effort is normal. No respiratory distress.     Breath sounds: Normal breath sounds. No stridor. No wheezing, rhonchi or rales.  Chest:     Chest wall: No tenderness.  Abdominal:     General: Bowel sounds are normal. There is no distension.     Palpations: Abdomen is soft. There is no mass.     Tenderness: There is no abdominal tenderness. There is no right CVA tenderness, left CVA tenderness, guarding or rebound. Negative signs include Murphy's sign, Rovsing's sign, McBurney's sign, psoas sign and obturator sign.     Hernia: No hernia is present.     Comments: Protuberant abdomen   Genitourinary:    Prostate: Normal.     Rectum: Guaiac result  negative.     Comments: Chaperone present during examination. Lymphadenopathy:     Cervical: No cervical adenopathy.  Skin:    General: Skin is warm and dry.     Capillary Refill: Capillary refill takes less than 2 seconds.  Neurological:     Mental Status: He is alert and oriented to person, place, and time.     Coordination: Coordination normal.  Psychiatric:        Mood and Affect: Mood normal.        Behavior: Behavior normal.        Thought Content: Thought content normal.        Judgment: Judgment normal.       Assessment & Plan:   1. Need for zoster vaccination   2. Family history of prostate cancer in father   63. Screening for colon cancer   4. Obesity, Class II, BMI 35-39.9     Healthcare maintenance Continue all medications as directed. Please schedule fasting lab appt in 2 months and regular follow-up in 6 months. Increase water intake, strive for at least 100 ounces/day.   Follow Heart Healthy diet Increase regular exercise.  Recommend at least 30 minutes daily, 5 days per week of walking, jogging, biking, swimming, YouTube/Pinterest workout videos.  Obesity, Class II, BMI 35-39.9 Body mass index is  38.55 kg/m.  Current wt 255 Encouraged to follow heart healthy diet and increase regular exercise He recently purchased [employer] and plans to start going 3 times week after [place] Year with his wife   Family history of prostate cancer in father Denies urinary sx's Father had prostate ca     FOLLOW-UP:  Return in about 6 months (around 11/07/2018) for Regular Follow Up.

## 2018-05-08 ENCOUNTER — Ambulatory Visit (INDEPENDENT_AMBULATORY_CARE_PROVIDER_SITE_OTHER): Payer: 59 | Admitting: Adult Health

## 2018-05-08 ENCOUNTER — Encounter: Payer: Self-pay | Admitting: Adult Health

## 2018-05-08 VITALS — BP 106/66 | HR 84 | Temp 98.4°F | Ht 68.25 in | Wt 255.4 lb

## 2018-05-08 DIAGNOSIS — Z23 Encounter for immunization: Secondary | ICD-10-CM

## 2018-05-08 DIAGNOSIS — E669 Obesity, unspecified: Secondary | ICD-10-CM

## 2018-05-08 DIAGNOSIS — Z1211 Encounter for screening for malignant neoplasm of colon: Secondary | ICD-10-CM

## 2018-05-08 DIAGNOSIS — Z8042 Family history of malignant neoplasm of prostate: Secondary | ICD-10-CM | POA: Insufficient documentation

## 2018-05-08 DIAGNOSIS — Z0000 Encounter for general adult medical examination without abnormal findings: Secondary | ICD-10-CM | POA: Diagnosis not present

## 2018-05-08 LAB — POC HEMOCCULT BLD/STL (OFFICE/1-CARD/DIAGNOSTIC): FECAL OCCULT BLD: NEGATIVE

## 2018-05-08 NOTE — Patient Instructions (Addendum)

## 2018-05-08 NOTE — Assessment & Plan Note (Signed)
Body mass index is 38.55 kg/m.  Current wt 255 Encouraged to follow heart healthy diet and increase regular exercise He recently purchased [employer] and plans to start going 3 times week after [place] with his wife

## 2018-05-08 NOTE — Assessment & Plan Note (Signed)
Denies urinary sx's Father had prostate ca

## 2018-05-08 NOTE — Assessment & Plan Note (Signed)
Continue all medications as directed. Please schedule fasting lab appt in 2 months and regular follow-up in 6 months. Increase water intake, strive for at least 100 ounces/day.   Follow Heart Healthy diet Increase regular exercise.  Recommend at least 30 minutes daily, 5 days per week of walking, jogging, biking, swimming, YouTube/Pinterest workout videos.

## 2018-05-11 ENCOUNTER — Encounter: Payer: Self-pay | Admitting: Adult Health

## 2018-05-31 ENCOUNTER — Other Ambulatory Visit: Payer: Self-pay | Admitting: Adult Health

## 2018-06-23 ENCOUNTER — Ambulatory Visit: Payer: 59

## 2018-06-23 DIAGNOSIS — E7849 Other hyperlipidemia: Secondary | ICD-10-CM

## 2018-06-23 DIAGNOSIS — Z0000 Encounter for general adult medical examination without abnormal findings: Secondary | ICD-10-CM

## 2018-06-23 DIAGNOSIS — E039 Hypothyroidism, unspecified: Secondary | ICD-10-CM

## 2018-06-23 DIAGNOSIS — Z8042 Family history of malignant neoplasm of prostate: Secondary | ICD-10-CM

## 2018-06-24 LAB — CBC WITH DIFFERENTIAL/PLATELET
Basophils Absolute: 0.1 10*3/uL (ref 0.0–0.2)
Basos: 1 %
EOS (ABSOLUTE): 0.3 10*3/uL (ref 0.0–0.4)
Eos: 5 %
Hematocrit: 46.6 % (ref 37.5–51.0)
Hemoglobin: 15.7 g/dL (ref 13.0–17.7)
IMMATURE GRANULOCYTES: 1 %
Immature Grans (Abs): 0.1 10*3/uL (ref 0.0–0.1)
Lymphocytes Absolute: 2.1 10*3/uL (ref 0.7–3.1)
Lymphs: 27 %
MCH: 29 pg (ref 26.6–33.0)
MCHC: 33.7 g/dL (ref 31.5–35.7)
MCV: 86 fL (ref 79–97)
Monocytes Absolute: 0.7 10*3/uL (ref 0.1–0.9)
Monocytes: 9 %
NEUTROS PCT: 57 %
Neutrophils Absolute: 4.4 10*3/uL (ref 1.4–7.0)
Platelets: 169 10*3/uL (ref 150–450)
RBC: 5.42 x10E6/uL (ref 4.14–5.80)
RDW: 12.2 % (ref 11.6–15.4)
WBC: 7.5 10*3/uL (ref 3.4–10.8)

## 2018-06-24 LAB — HEMOGLOBIN A1C
Est. average glucose Bld gHb Est-mCnc: 123 mg/dL
Hgb A1c MFr Bld: 5.9 % — ABNORMAL HIGH (ref 4.8–5.6)

## 2018-06-24 LAB — COMPREHENSIVE METABOLIC PANEL
ALT: 34 IU/L (ref 0–44)
AST: 23 IU/L (ref 0–40)
Albumin/Globulin Ratio: 1.6 (ref 1.2–2.2)
Albumin: 4.4 g/dL (ref 3.8–4.9)
Alkaline Phosphatase: 72 IU/L (ref 39–117)
BUN/Creatinine Ratio: 12 (ref 9–20)
BUN: 14 mg/dL (ref 6–24)
Bilirubin Total: 0.5 mg/dL (ref 0.0–1.2)
CO2: 23 mmol/L (ref 20–29)
Calcium: 9.6 mg/dL (ref 8.7–10.2)
Chloride: 102 mmol/L (ref 96–106)
Creatinine, Ser: 1.18 mg/dL (ref 0.76–1.27)
GFR calc Af Amer: 82 mL/min/{1.73_m2} (ref >59)
GFR calc non Af Amer: 71 mL/min/{1.73_m2} (ref >59)
Globulin, Total: 2.7 g/dL (ref 1.5–4.5)
Glucose: 120 mg/dL — ABNORMAL HIGH (ref 65–99)
Potassium: 5.4 mmol/L — ABNORMAL HIGH (ref 3.5–5.2)
Sodium: 140 mmol/L (ref 134–144)
Total Protein: 7.1 g/dL (ref 6.0–8.5)

## 2018-06-24 LAB — LIPID PANEL
Chol/HDL Ratio: 3.7 ratio (ref 0.0–5.0)
Cholesterol, Total: 133 mg/dL (ref 100–199)
HDL: 36 mg/dL — ABNORMAL LOW (ref >39)
LDL Calculated: 76 mg/dL (ref 0–99)
Triglycerides: 105 mg/dL (ref 0–149)
VLDL CHOLESTEROL CAL: 21 mg/dL (ref 5–40)

## 2018-06-24 LAB — TSH: TSH: 4.39 u[IU]/mL (ref 0.450–4.500)

## 2018-06-24 LAB — PSA: Prostate Specific Ag, Serum: 1.2 ng/mL (ref 0.0–4.0)

## 2018-07-10 ENCOUNTER — Telehealth: Payer: Self-pay

## 2018-07-10 NOTE — Telephone Encounter (Signed)
Received Epic notification that pt has not read MyChart message regarding results.     Recent lab test results   From Fonnie Mu, CMA To Elijah Small Sent 06/26/2018 10:08 AM  Elijah Small, Elijah Small has reviewed your lab test results. Your PSA, thyroid test, metabolic panel, and complete blood counter were all normal.   Your cholesterol panel showed a total cholesterol of 133 mg/dL, an HDL (good cholesterol) of 36 mg/dL and an LDL (bad cholesterol) of 76. Your HDL still remains below goal. Please increase your regular exercise to help normalize this number. Your A1c (3 month average of blood sugars) showed a slight improvement from last year, however it is still in the pre-diabetic range at 5.9. Please reduce your sugar and carbohydrate intake and again increase your regular exercise.   Wishing you well,  Kenney Houseman, CMA for  Mina Marble, NP      [address]   MyChart User Last Read On  Elijah Small Not Read       LVM for pt to call to discuss.  Charyl Bigger, CMA

## 2018-07-14 ENCOUNTER — Other Ambulatory Visit: Payer: Self-pay | Admitting: Adult Health

## 2018-07-14 ENCOUNTER — Encounter: Payer: Self-pay | Admitting: Adult Health

## 2018-07-14 NOTE — Telephone Encounter (Signed)
Letter mailed to pt since no response to messages.  Charyl Bigger, CMA

## 2018-08-03 ENCOUNTER — Other Ambulatory Visit: Payer: Self-pay

## 2018-08-03 ENCOUNTER — Ambulatory Visit (INDEPENDENT_AMBULATORY_CARE_PROVIDER_SITE_OTHER): Payer: 59 | Admitting: Allergy & Immunology

## 2018-08-03 ENCOUNTER — Encounter: Payer: Self-pay | Admitting: Allergy & Immunology

## 2018-08-03 VITALS — BP 110/68 | HR 96 | Temp 98.5°F | Resp 20 | Ht 69.0 in | Wt 245.0 lb

## 2018-08-03 DIAGNOSIS — J45990 Exercise induced bronchospasm: Secondary | ICD-10-CM | POA: Diagnosis not present

## 2018-08-03 DIAGNOSIS — J3089 Other allergic rhinitis: Secondary | ICD-10-CM

## 2018-08-03 NOTE — Patient Instructions (Addendum)
1. Perennial allergic rhinitis (dust mites, dog, cat, indoor molds) - Testing from 2016 was positive to dust mite, cat, dog, and an indoor mold mix. - I think the dust mites are most likely contirbuting to your smptoms. - Avoidance measures for dust mites and pets provided. - Stop Flonase. - Start a daily antihistamine (Allegra, Zyrtec, or Xyzal) to help dry out the nose.  - Start Xhance 2 sprays per nostril up to twice daily to help control mucous production and postnasal drip.  - We will send in the script to the [facility] outpatient pharmacy, and they will call you to confirm your shipping address. - You can review how to use the device here: https://www.xhance.com - Ask to be enrolled in the auto-refill program so you can get a year for free. - Consider Odactra (dust mite sublingual immunotherapy) in the future.  2. Return in about 4 weeks (around 08/31/2018). (virtual visit is fine)   Please inform us of any Emergency Department visits, hospitalizations, or changes in symptoms. Call us before going to the ED for breathing or allergy symptoms since we might be able to fit you in for a sick visit. Feel free to contact us anytime with any questions, problems, or concerns.  It was a pleasure to see you again today!  Websites that have reliable patient information: 1. American Academy of Asthma, Allergy, and Immunology: www.aaaai.org 2. Food Allergy Research and Education (FARE): foodallergy.org 3. Mothers of Asthmatics: http://www.asthmacommunitynetwork.org 4. American College of Allergy, Asthma, and Immunology: www.acaai.org  "Like" [place] on Facebook and Instagram for our latest updates!      Make sure you are registered to vote! If you have moved or changed any of your contact information, you will need to get this updated before voting!    Voter ID laws are NOT going into effect for the General Election in November 2020! DO NOT let this stop you from exercising your right to vote!       Control of House Dust Mite Allergen    House dust mites play a major role in allergic asthma and rhinitis.  They occur in environments with high humidity wherever human skin, the food for dust mites is found. High levels have been detected in dust obtained from mattresses, pillows, carpets, upholstered furniture, bed covers, clothes and soft toys.  The principal allergen of the house dust mite is found in its feces.  A gram of dust may contain 1,000 mites and 250,000 fecal particles.  Mite antigen is easily measured in the air during house cleaning activities.    1. Encase mattresses, including the box spring, and pillow, in an air tight cover.  Seal the zipper end of the encased mattresses with wide adhesive tape. 2. Wash the bedding in water of 130 degrees Farenheit weekly.  Avoid cotton comforters/quilts and flannel bedding: the most ideal bed covering is the dacron comforter. 3. Remove all upholstered furniture from the bedroom. 4. Remove carpets, carpet padding, rugs, and non-washable window drapes from the bedroom.  Wash drapes weekly or use plastic window coverings. 5. Remove all non-washable stuffed toys from the bedroom.  Wash stuffed toys weekly. 6. Have the room cleaned frequently with a vacuum cleaner and a damp dust-mop.  The patient should not be in a room which is being cleaned and should wait 1 hour after cleaning before going into the room. 7. Close and seal all heating outlets in the bedroom.  Otherwise, the room will become filled with dust-laden air.  An  electric heater can be used to heat the room. 8. Reduce indoor humidity to less than 50%.  Do not use a humidifier.  Control of Dog or Cat Allergen  Avoidance is the best way to manage a dog or cat allergy. If you have a dog or cat and are allergic to dog or cats, consider removing the dog or cat from the home. If you have a dog or cat but don't want to find it a new home, or if your family wants a pet even though someone  in the household is allergic, here are some strategies that may help keep symptoms at bay:  1. Keep the pet out of your bedroom and restrict it to only a few rooms. Be advised that keeping the dog or cat in only one room will not limit the allergens to that room. 2. Don't pet, hug or kiss the dog or cat; if you do, wash your hands with soap and water. 3. High-efficiency particulate air (HEPA) cleaners run continuously in a bedroom or living room can reduce allergen levels over time. 4. Regular use of a high-efficiency vacuum cleaner or a central vacuum can reduce allergen levels. 5. Giving your dog or cat a bath at least once a week can reduce airborne allergen.  Control of Mold Allergen   Mold and fungi can grow on a variety of surfaces provided certain temperature and moisture conditions exist.  Outdoor molds grow on plants, decaying vegetation and soil.  The major outdoor mold, Alternaria and Cladosporium, are found in very high numbers during hot and dry conditions.  Generally, a late Summer - Fall peak is seen for common outdoor fungal spores.  Rain will temporarily lower outdoor mold spore count, but counts rise rapidly when the rainy period ends.  The most important indoor molds are Aspergillus and Penicillium.  Dark, humid and poorly ventilated basements are ideal sites for mold growth.  The next most common sites of mold growth are the bathroom and the kitchen.  Indoor (Perennial) Mold Control   Positive indoor molds via skin testing: Aspergillus and Penicillium  1. Maintain humidity below 50%. 2. Clean washable surfaces with 5% bleach solution. 3. Remove sources e.g. contaminated carpets.

## 2018-08-03 NOTE — Progress Notes (Signed)
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 Perennial allergic rhinitis (dust mites, dog, cat, indoor molds) - Testing from 2016 was positive to dust mite, cat, dog, and an indoor mold mix. - I think the dust mites are most likely contirbuting to your smptoms. - Avoidance measures for dust mites and pets provided. - Stop Flonase. - Start a daily antihistamine (Allegra, Zyrtec, or Xyzal) to help dry out the nose.  - Start Xhance 2 sprays per nostril up to twice daily to help control mucous production and postnasal drip.  - We will send in the script to the [facility] outpatient pharmacy, and they will call you to confirm your shipping address. - You can review how to use the device here: https://www.xhance.com - Ask to be enrolled in the auto-refill program so you can get a year for free. - Consider Odactra (dust mite sublingual immunotherapy) in the future.  2. Exercise induced bronchospasm - with clear worsening in cold air as well Elijah Small deferred. - There is no indication for a controller medication. - Continue with albuterol every 4-6 hours as needed. - You can also use two puffs prior to physical activity.   3. Return in about 4 weeks (around 08/31/2018) (virtual visit is fine)  Subjective:   Elijah Small is a 53 y.o. male presenting today for evaluation of  Chief Complaint  Patient presents with  . Cough    dx with sleep apnea.  has CPAP machine.  . Nasal Congestion    Elijah Small has a history of the following: Patient Active Problem List   Diagnosis Date Noted  . Family history of prostate cancer in father 05/08/2018  . Cough  variant asthma 09/22/2017  . Cough in adult 09/07/2017  . Wheezing 09/07/2017  . Screening for colon cancer 03/22/2017  . Healthcare maintenance 03/22/2017  . Atypical mole 12/14/2016  . BPPV (benign paroxysmal positional vertigo) 12/14/2016  . Other fatigue 06/10/2016  . Family history of diabetes mellitus 06/10/2016  . Acquired hypothyroidism 06/10/2016  . Need for influenza vaccination 06/10/2016  . Obesity, Class II, BMI 35-39.9 06/10/2016  . Need for prophylactic vaccination with combined diphtheria-tetanus-pertussis (DTP) vaccine 08/06/2013  . Anxiety state 02/04/2013  . Other hyperlipidemia 02/04/2013  . GERD (gastroesophageal reflux disease)   . Diverticulitis   . Sleep apnea   . History of colon polyps     History obtained from: chart review and patient.  Elijah Small was referred by Elijah Grandchild, NP.     Elijah Small is a 53 y.o. male presenting for an evaluation of chronic rhinitis.  He was previously evaluated by Elijah Small in June 2016.  At that time, he had testing that was positive to dust mite, cat, dog, and indoor molds.  It was recommended that he take Nasacort as well as Allegra.  He also had Patanase added on a as needed basis.  Since last visit, he is continued to have problems.  He reports that he has chronic congestion which occurs year-round.  Symptoms seem worse indoors as well as at night.  He does have a history of sleep apnea and has a CPAP.  The CPAP just  dries his loose mucus down his throat, however.  He has not instituted any dust mite avoidance measures.  He seems rather surprised that he is allergic to dust mites.  He says he does not remember what he was positive to at that last visit.  He does not get antibiotics for any of these symptoms.  He will use Afrin for a few days at a time with improvement.  Currently he is on Flonase Sensimist 1 to 2 sprays per nostril daily.  He reports a loss of smell with this, however.  Of note, he does have a dog  in the home named Elijah Small.  The dog is 17 years old and there is no chance that he is going anywhere. They have evicted Elijah Small the dog from their bedroom.    He does not have a diagnosis of asthma.  However, he was seen by Elijah Small in May 2019 and given a Symbicort inhaler.  He did use the sample up but felt no improvement with it.  He did have spirometry done but was never given an official diagnosis of asthma.  He does report some bronchospasm that is worse in the cold weather.  He will use albuterol with improvement in this.  This is very rare, however.  Otherwise, there is no history of other atopic diseases, including asthma, food allergies, drug allergies, stinging insect allergies, eczema, urticaria or contact dermatitis. There is no significant infectious history. Vaccinations are up to date.    Past Medical History: Patient Active Problem List   Diagnosis Date Noted  . Family history of prostate cancer in father 05/08/2018  . Cough variant asthma 09/22/2017  . Cough in adult 09/07/2017  . Wheezing 09/07/2017  . Screening for colon cancer 03/22/2017  . Healthcare maintenance 03/22/2017  . Atypical mole 12/14/2016  . BPPV (benign paroxysmal positional vertigo) 12/14/2016  . Other fatigue 06/10/2016  . Family history of diabetes mellitus 06/10/2016  . Acquired hypothyroidism 06/10/2016  . Need for influenza vaccination 06/10/2016  . Obesity, Class II, BMI 35-39.9 06/10/2016  . Need for prophylactic vaccination with combined diphtheria-tetanus-pertussis (DTP) vaccine 08/06/2013  . Anxiety state 02/04/2013  . Other hyperlipidemia 02/04/2013  . GERD (gastroesophageal reflux disease)   . Diverticulitis   . Sleep apnea   . History of colon polyps     Medication List:  Allergies as of 08/03/2018   No Known Allergies     Medication List       Accurate as of August 03, 2018 11:59 PM. Always use your most recent med list.        albuterol 108 (90 Base) MCG/ACT inhaler Commonly  known as:  PROVENTIL HFA;VENTOLIN HFA Inhale 2 puffs into the lungs every 6 (six) hours as needed for wheezing or shortness of breath.   budesonide-formoterol 160-4.5 MCG/ACT inhaler Commonly known as:  Symbicort Take 2 puffs first thing in am and then another 2 puffs about 12 hours later.   fluticasone 50 MCG/ACT nasal spray Commonly known as:  FLONASE Place 2 sprays into both nostrils daily.   rosuvastatin 40 MG tablet Commonly known as:  CRESTOR TAKE 1 TABLET BY MOUTH EVERY DAY   sertraline 100 MG tablet Commonly known as:  ZOLOFT Take 0.5 tablets (50 mg total) by mouth daily.   Synthroid 75 MCG tablet Generic drug:  levothyroxine TAKE 1 TABLET DAILY BEFORE BREAKFAST   Vitamin D3 50 MCG (2000 UT) Tabs Take 1 tablet by mouth daily.       Birth  History: non-contributory  Developmental History: non-contributory.   Past Surgical History: Past Surgical History:  Procedure Laterality Date  . COLECTOMY     Dr Margot Chimes - 12 inches  . KNEE SURGERY Bilateral      Family History: Family History  Problem Relation Age of Onset  . Hypertension Father   . Heart disease Father        He died at 46 from MI   . Stroke Mother   . Dementia Mother   . Cancer Brother        thyroid  . Diabetes Brother   . Hypertension Brother   . Asthma Son   . Allergic rhinitis Son   . Asthma Daughter   . Allergic rhinitis Daughter      Social History: [place] lives at home with his wife in a house that is around 17 years old.  There are carpeted wood floors throughout the home.  They have central air and heat.  They do not have dust mite covers on the bedding.  There are dogs throughout the home.  He currently works with [employer].   Review of Systems  Constitutional: Negative.  Negative for fever, malaise/fatigue and weight loss.  HENT: Positive for congestion. Negative for ear discharge, ear pain and sore throat.   Eyes: Negative for pain, discharge and redness.  Respiratory:  Negative for cough, sputum production, shortness of breath and wheezing.   Cardiovascular: Negative.  Negative for chest pain and palpitations.  Gastrointestinal: Negative for abdominal pain and heartburn.  Skin: Negative.  Negative for itching and rash.  Neurological: Negative for dizziness and headaches.  Endo/Heme/Allergies: Positive for environmental allergies. Does not bruise/bleed easily.       Objective:   Blood pressure 110/68, pulse 96, temperature 98.5 F (36.9 C), temperature source Oral, resp. rate 20, height 5\' 9"  (1.753 m), weight 245 lb (111.1 kg), SpO2 96 %. Body mass index is 36.18 kg/m.   Physical Exam:   Physical Exam  Constitutional: He appears well-developed.  Pleasant talkative male.  HENT:  Head: Normocephalic and atraumatic.  Right Ear: Tympanic membrane, external ear and ear canal normal. No drainage, swelling or tenderness. Tympanic membrane is not injected, not scarred, not erythematous, not retracted and not bulging.  Left Ear: Tympanic membrane, external ear and ear canal normal. No drainage, swelling or tenderness. Tympanic membrane is not injected, not scarred, not erythematous, not retracted and not bulging.  Nose: Mucosal edema and rhinorrhea present. No nasal deformity or septal deviation. No epistaxis. Right sinus exhibits no maxillary sinus tenderness and no frontal sinus tenderness. Left sinus exhibits no maxillary sinus tenderness and no frontal sinus tenderness.  Mouth/Throat: Uvula is midline and oropharynx is clear and moist. Mucous membranes are not pale and not dry.  Turbinate hypertrophy bilaterally.  There is no epistaxis present.  Septum appears midline.  Eyes: Pupils are equal, round, and reactive to light. Conjunctivae and EOM are normal. Right eye exhibits no chemosis and no discharge. Left eye exhibits no chemosis and no discharge. Right conjunctiva is not injected. Left conjunctiva is not injected.  Cardiovascular: Normal rate,  regular rhythm and normal heart sounds.  Respiratory: Effort normal and breath sounds normal. No accessory muscle usage. No tachypnea. No respiratory distress. He has no wheezes. He has no rhonchi. He has no rales. He exhibits no tenderness.  Moving air well in all lung fields.  GI: There is no abdominal tenderness. There is no rebound and no guarding.  Lymphadenopathy:  Head (right side): No submandibular, no tonsillar and no occipital adenopathy present.       Head (left side): No submandibular, no tonsillar and no occipital adenopathy present.    He has no cervical adenopathy.  Neurological: He is alert.  Skin: No abrasion, no petechiae and no rash noted. Rash is not papular, not vesicular and not urticarial. No erythema. No pallor.  Psychiatric: He has a normal mood and affect.     Diagnostic studies: none    Salvatore Marvel, MD [facility]

## 2018-08-04 ENCOUNTER — Encounter: Payer: Self-pay | Admitting: Allergy & Immunology

## 2018-08-04 ENCOUNTER — Telehealth: Payer: Self-pay

## 2018-08-04 ENCOUNTER — Other Ambulatory Visit: Payer: Self-pay

## 2018-08-04 DIAGNOSIS — J45990 Exercise induced bronchospasm: Secondary | ICD-10-CM | POA: Insufficient documentation

## 2018-08-04 DIAGNOSIS — J3089 Other allergic rhinitis: Secondary | ICD-10-CM | POA: Insufficient documentation

## 2018-08-04 MED ORDER — FLUTICASONE PROPIONATE 93 MCG/ACT NA EXHU: 2.0000 | INHALANT_SUSPENSION | Freq: Two times a day (BID) | NASAL | 12 refills | Status: DC

## 2018-08-04 NOTE — Telephone Encounter (Signed)
Awesome.  Thanks for calling him.  Salvatore Marvel, MD [facility]

## 2018-08-04 NOTE — Telephone Encounter (Signed)
Valentina Shaggy, MD  [facility]        Can someone call the patient to see if he needs a refill on albuterol? I forgot to ask him that yesterday.    Pt stated he has refills left on the albuterol.

## 2018-10-12 ENCOUNTER — Other Ambulatory Visit: Payer: Self-pay | Admitting: Adult Health

## 2018-10-31 ENCOUNTER — Telehealth: Payer: Self-pay

## 2018-10-31 ENCOUNTER — Other Ambulatory Visit: Payer: Self-pay | Admitting: Adult Health

## 2018-10-31 NOTE — Telephone Encounter (Signed)
Please call pt to schedule telemedicine visit to f/u on depression medication.  Charyl Bigger, CMA

## 2018-11-21 NOTE — Progress Notes (Signed)
Virtual Visit via Telephone Note  I connected with Elijah Small on 11/22/2018 at  1:45 PM EDT by telephone and verified that I am speaking with the correct person using two identifiers.  Location: Patient: Home Provider: In Clinic   I discussed the limitations, risks, security and privacy concerns of performing an evaluation and management service by telephone and the availability of in person appointments. I also discussed with the patient that there may be a patient responsible charge related to this service. The patient expressed understanding and agreed to proceed.   History of Present Illness: Elijah Small calls in today for regular f/u: GAD, Hypothyroidism, HLD He reports medication compliance, denies SE He reports stable mood, denies SI/HI He reports anxiety level well controlled Currently taking Sertraline 50mg  QD He has increased daily water intake- 5 x 16 oz water bottles/day He has been steadily reducing saturated fat/CHO/sugar He and his wife are in the process of moving to [place] He continues to abstain from tobacco/vape/ETOH use   Patient Care Team    Relationship Specialty Notifications Start End  Esaw Grandchild, NP PCP - General Family Medicine  12/10/16     Patient Active Problem List   Diagnosis Date Noted  . Exercise induced bronchospasm 08/04/2018  . Perennial allergic rhinitis 08/04/2018  . Family history of prostate cancer in father 05/08/2018  . Cough variant asthma 09/22/2017  . Cough in adult 09/07/2017  . Wheezing 09/07/2017  . Screening for colon cancer 03/22/2017  . Healthcare maintenance 03/22/2017  . Atypical mole 12/14/2016  . BPPV (benign paroxysmal positional vertigo) 12/14/2016  . Other fatigue 06/10/2016  . Family history of diabetes mellitus 06/10/2016  . Acquired hypothyroidism 06/10/2016  . Need for influenza vaccination 06/10/2016  . Obesity, Class II, BMI 35-39.9 06/10/2016  . Need for prophylactic vaccination with  combined diphtheria-tetanus-pertussis (DTP) vaccine 08/06/2013  . Anxiety state 02/04/2013  . Other hyperlipidemia 02/04/2013  . GERD (gastroesophageal reflux disease)   . Diverticulitis   . Sleep apnea   . History of colon polyps      Past Medical History:  Diagnosis Date  . Anxiety   . Asthma   . Diverticulitis   . GERD (gastroesophageal reflux disease)   . History of colon polyps   . Hypercholesteremia   . Sleep apnea   . Thyroid disease      Past Surgical History:  Procedure Laterality Date  . COLECTOMY     Dr Margot Chimes - 12 inches  . KNEE SURGERY Bilateral      Family History  Problem Relation Age of Onset  . Hypertension Father   . Heart disease Father        He died at 53 from MI   . Stroke Mother   . Dementia Mother   . Cancer Brother        thyroid  . Diabetes Brother   . Hypertension Brother   . Asthma Son   . Allergic rhinitis Son   . Asthma Daughter   . Allergic rhinitis Daughter      Social History   Substance and Sexual Activity  Drug Use No     Social History   Substance and Sexual Activity  Alcohol Use No     Social History   Tobacco Use  Smoking Status Never Smoker  Smokeless Tobacco Current User  . Types: Snuff     Outpatient Encounter Medications as of 11/22/2018  Medication Sig  . albuterol (PROVENTIL HFA;VENTOLIN HFA) 108 (90 Base) MCG/ACT  inhaler Inhale 2 puffs into the lungs every 6 (six) hours as needed for wheezing or shortness of breath.  . budesonide-formoterol (SYMBICORT) 160-4.5 MCG/ACT inhaler Take 2 puffs first thing in am and then another 2 puffs about 12 hours later. (Patient not taking: Reported on 08/03/2018)  . Cholecalciferol (VITAMIN D3) 2000 units TABS Take 1 tablet by mouth daily.  . fluticasone (FLONASE) 50 MCG/ACT nasal spray Place 2 sprays into both nostrils daily.  . Fluticasone Propionate (XHANCE) 93 MCG/ACT EXHU Place 2 puffs into the nose 2 (two) times daily.  . rosuvastatin (CRESTOR) 40 MG tablet  TAKE 1 TABLET BY MOUTH EVERY DAY  . sertraline (ZOLOFT) 100 MG tablet Take 0.5 tablets (50 mg total) by mouth daily. PATIENT MUST HAVE OFFICE VISIT PRIOR TO ANY FURTHER REFILLS  . SYNTHROID 75 MCG tablet TAKE 1 TABLET DAILY BEFORE BREAKFAST   No facility-administered encounter medications on file as of 11/22/2018.     Allergies: Patient has no known allergies.  There is no height or weight on file to calculate BMI.  There were no vitals taken for this visit. Review of Systems: General:   Denies fever, chills, unexplained weight loss.  Optho/Auditory:   Denies visual changes, blurred vision/LOV Respiratory:   Denies SOB, DOE more than baseline levels.  Cardiovascular:   Denies chest pain, palpitations, new onset peripheral edema  Gastrointestinal:   Denies nausea, vomiting, diarrhea.  Genitourinary: Denies dysuria, freq/ urgency, flank pain or discharge from genitals.  Endocrine:     Denies hot or cold intolerance, polyuria, polydipsia. Musculoskeletal:   Denies unexplained myalgias, joint swelling, unexplained arthralgias, gait problems.  Skin:  Denies rash, suspicious lesions Neurological:     Denies dizziness, unexplained weakness, numbness  Psychiatric/Behavioral:   Denies mood changes, suicidal or homicidal ideations, hallucinations This patient does not have sx concerning for COVID-19 Infection (ie; fever, chills, cough, new or worsening shortness of breath).   Observations/Objective: No acute distress noted  Assessment and Plan: Continue all medications as directed. Remain well hydrated, follow Mediterranean diet Remain as active as possible Continue to abstain from tobacco/vape/excessive ETOH use  Follow Up Instructions: 6 months CPE, fasting labs the week prior    I discussed the assessment and treatment plan with the patient. The patient was provided an opportunity to ask questions and all were answered. The patient agreed with the plan and demonstrated an  understanding of the instructions.   The patient was advised to call back or seek an in-person evaluation if the symptoms worsen or if the condition fails to improve as anticipated.  I provided 58minutes of non-face-to-face time during this encounter.   Esaw Grandchild, NP

## 2018-11-22 ENCOUNTER — Other Ambulatory Visit: Payer: Self-pay

## 2018-11-22 ENCOUNTER — Encounter: Payer: Self-pay | Admitting: Adult Health

## 2018-11-22 ENCOUNTER — Ambulatory Visit (INDEPENDENT_AMBULATORY_CARE_PROVIDER_SITE_OTHER): Payer: 59 | Admitting: Adult Health

## 2018-11-22 DIAGNOSIS — E039 Hypothyroidism, unspecified: Secondary | ICD-10-CM

## 2018-11-22 DIAGNOSIS — Z0000 Encounter for general adult medical examination without abnormal findings: Secondary | ICD-10-CM

## 2018-11-22 MED ORDER — SERTRALINE HCL 50 MG PO TABS: 50.0000 mg | ORAL_TABLET | Freq: Every day | ORAL | 1 refills | Status: DC

## 2018-11-22 NOTE — Assessment & Plan Note (Signed)
Assessment and Plan: Continue all medications as directed. Remain well hydrated, follow Mediterranean diet Remain as active as possible Continue to abstain from tobacco/vape/excessive ETOH use  Follow Up Instructions: 6 months CPE, fasting labs the week prior

## 2018-11-22 NOTE — Assessment & Plan Note (Signed)
Levothyroxine 98mcg QD

## 2018-11-28 IMAGING — DX DG CHEST 2V
2 series · 2 of 2 positions shown · non-contrast
Comparison: None.

CLINICAL DATA: Persistent cough and intermittent wheezing

EXAM:
CHEST - 2 VIEW

[chest pa]
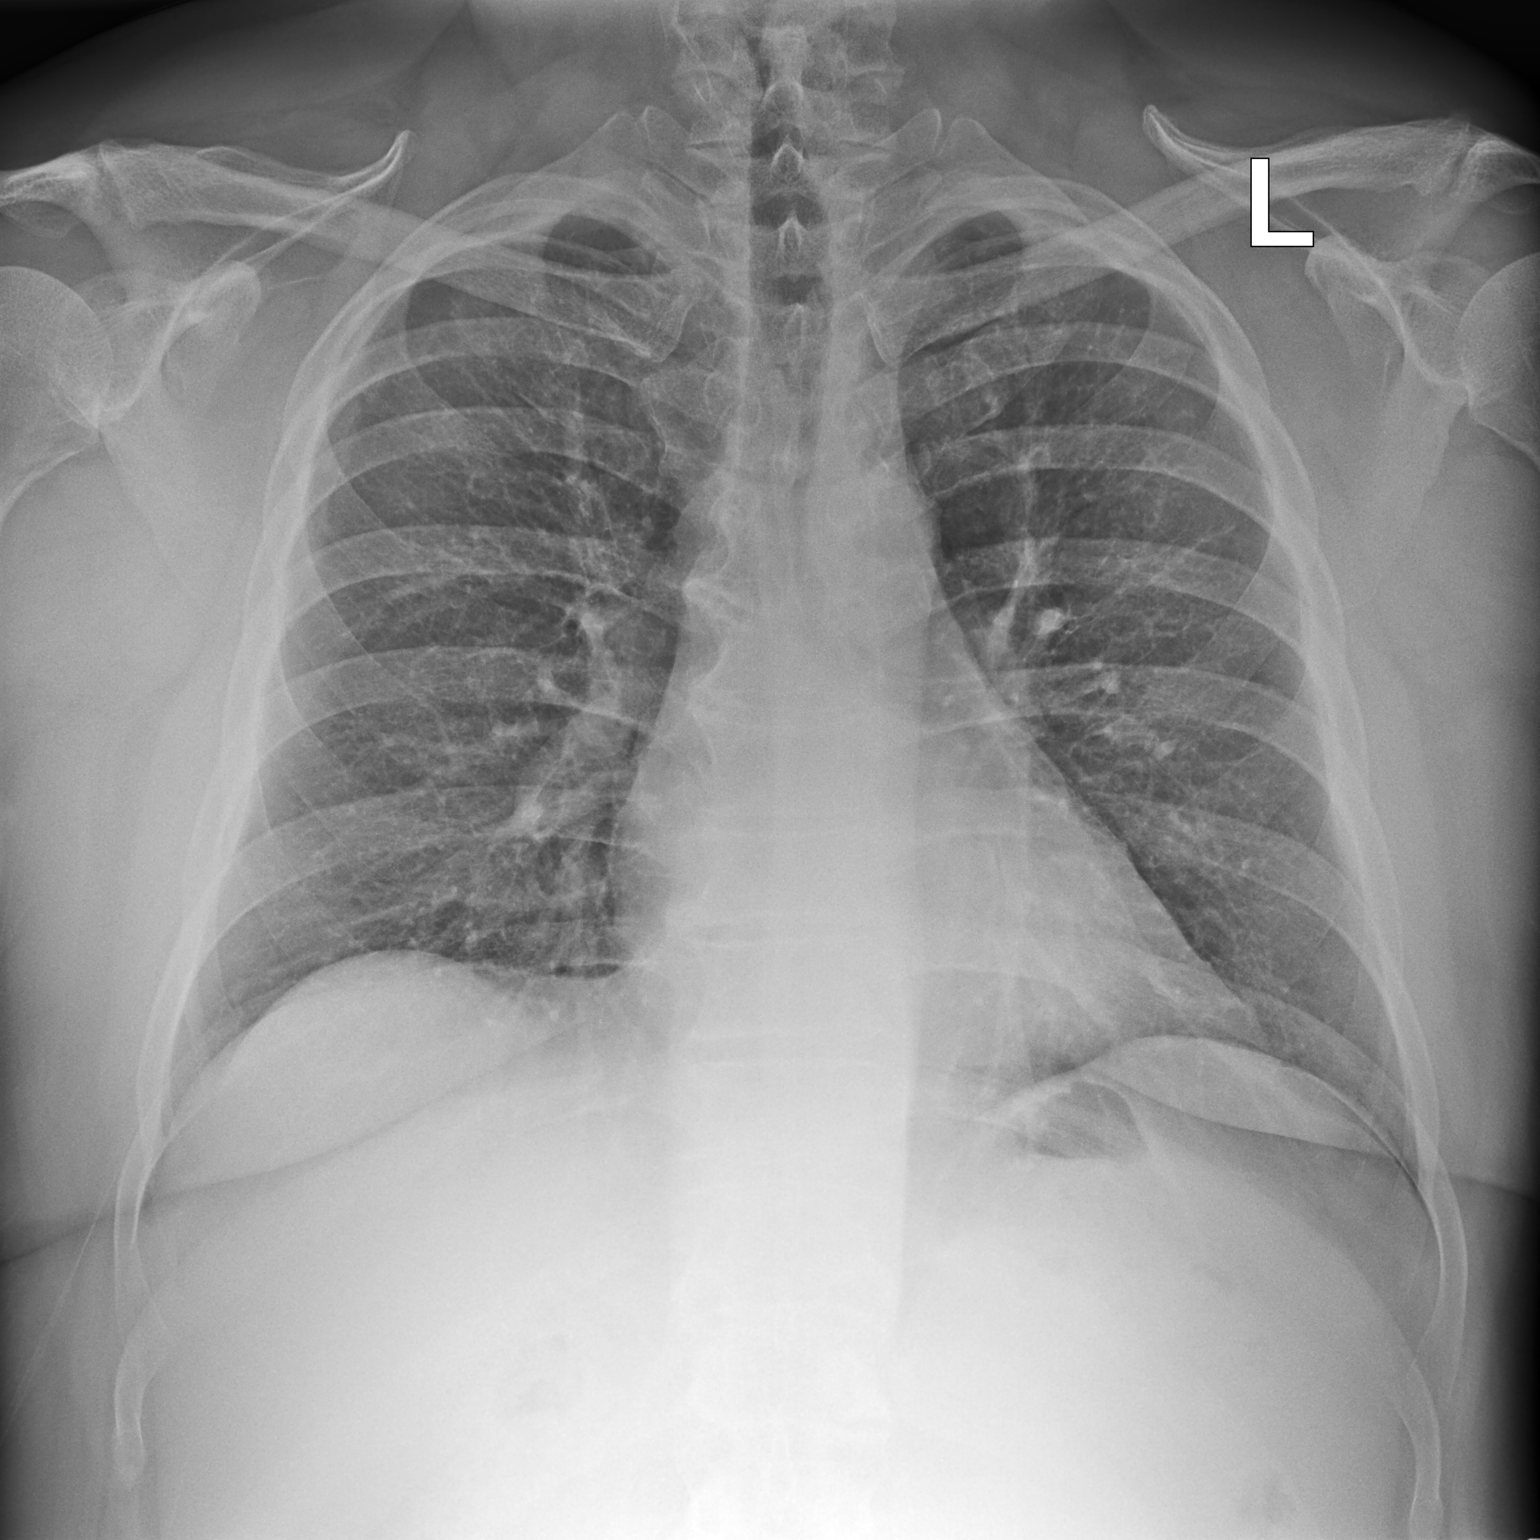

[chest lat]
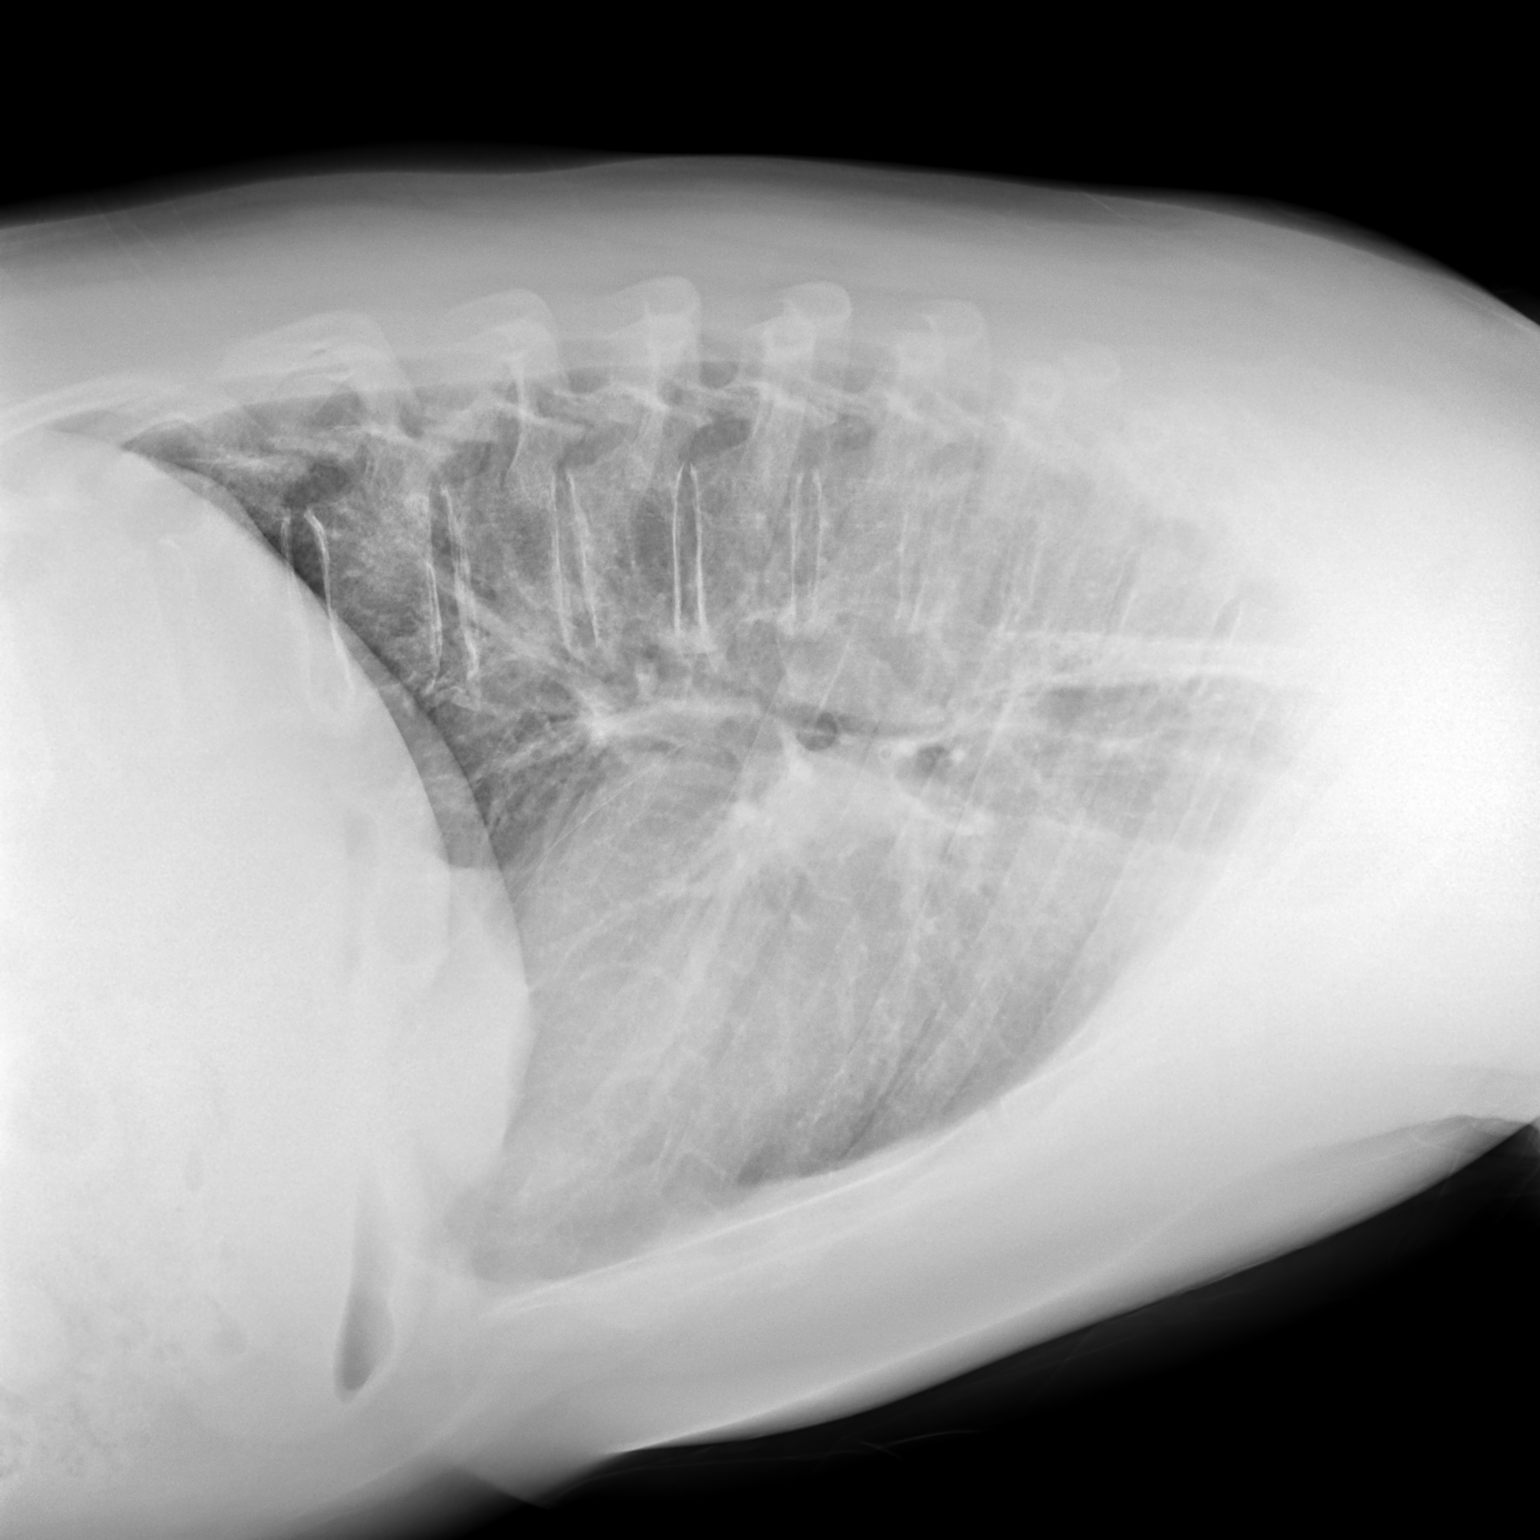

[2 of 2 positions shown; findings below may reference images not displayed]

FINDINGS: The heart size and mediastinal contours are within normal limits.
Both lungs are clear. The visualized skeletal structures are
unremarkable.
IMPRESSION: No active cardiopulmonary disease.

## 2018-11-29 ENCOUNTER — Other Ambulatory Visit: Payer: Self-pay | Admitting: Adult Health

## 2019-01-10 ENCOUNTER — Other Ambulatory Visit: Payer: Self-pay | Admitting: Adult Health

## 2019-02-16 ENCOUNTER — Other Ambulatory Visit: Payer: Self-pay | Admitting: Adult Health

## 2019-03-26 ENCOUNTER — Encounter: Payer: Self-pay | Admitting: Adult Health

## 2019-03-26 ENCOUNTER — Other Ambulatory Visit: Payer: Self-pay

## 2019-03-26 DIAGNOSIS — Z1211 Encounter for screening for malignant neoplasm of colon: Secondary | ICD-10-CM

## 2019-03-27 ENCOUNTER — Encounter: Payer: Self-pay | Admitting: Nurse Practitioner

## 2019-04-14 NOTE — Progress Notes (Signed)
04/14/2019 Elijah Small [id number] 03-Oct-1965   HISTORY OF PRESENT ILLNESS: Elijah Small is a 53 year old male with a past medical history of anxiety, asthma, hypothyroidism, hypercholesterolemia, sleep apnea uses CPAP, diverticulitis with microperforation 2008 s/p colectomy 05/31/2007, colon polyps and remote GERD.  He presents today to schedule a colonoscopy.  His most recent colonoscopy was completed 06/07/2014 by Dr. Penelope [facility] with [facility] which identified a few diverticula in the descending and ascending colon and internal hemorrhoids.  He was advised to repeat a colonoscopy in 5 years.  He denies having any upper or lower abdominal pain.  He is passing a normal formed brown bowel movement once daily.  No rectal bleeding or melena.  No family history of colorectal cancer.  His father had colon polyps and prostate cancer.  He denies any complaints today.  CBC Latest Ref Rng & Units 06/23/2018 06/10/2016 12/06/2012  WBC 3.4 - 10.8 x10E3/uL 7.5 7.8 5.8  Hemoglobin 13.0 - 17.7 g/dL 15.7 15.5 14.3  Hematocrit 37.5 - 51.0 % 46.6 44.0 40.7  Platelets 150 - 450 x10E3/uL 169 155 132(L)    Colonoscopy by Dr. Penelope Small 06/07/2014: -Diverticulosis in the descending colon and in the ascending colon -Internal hemorrhoids -No specimens collected -Repeat colonoscopy in 5 years for surveillance  EGD and colonoscopy 09/15/2004: 1. ESOPHAGUS, DISTAL, BIOPSY: GASTRIC CARDIAC TYPE MUCOSA WITH  NO EVIDENCE OF INTESTINAL METAPLASIA, DYSPLASIA OR MALIGNANCY.  2. STOMACH: BENIGN GASTRIC MUCOSA. NO HELICOBACTER PYLORI,  INTESTINAL METAPLASIA OR ACTIVE INFLAMMATION IDENTIFIED.  3. COLON, AT 40 CM, POLYP(S): ADENOMATOUS POLYP(S). NO HIGH  GRADE DYSPLASIA OR INVASIVE MALIGNANCY IDENTIFIED.   Past Medical History:  Diagnosis Date  . Anxiety   . Asthma   . Diverticulitis   . GERD (gastroesophageal reflux disease)   . History of colon polyps   . Hypercholesteremia   . Sleep apnea   . Thyroid  disease    Past Surgical History:  Procedure Laterality Date  . COLECTOMY     Dr Elijah Small - 12 inches  . KNEE SURGERY Bilateral     reports that he has never smoked. His smokeless tobacco use includes snuff. He reports that he does not drink alcohol or use drugs. family history includes Allergic rhinitis in his daughter and son; Asthma in his daughter and son; Cancer in his brother; Dementia in his mother; Diabetes in his brother; Heart disease in his father; Hypertension in his brother and father; Stroke in his mother. No Known Allergies    Outpatient Encounter Medications as of 04/16/2019  Medication Sig  . albuterol (PROVENTIL HFA;VENTOLIN HFA) 108 (90 Base) MCG/ACT inhaler Inhale 2 puffs into the lungs every 6 (six) hours as needed for wheezing or shortness of breath.  . budesonide-formoterol (SYMBICORT) 160-4.5 MCG/ACT inhaler Take 2 puffs first thing in am and then another 2 puffs about 12 hours later. (Patient taking differently: Inhale 2 puffs into the lungs daily. )  . Cholecalciferol (VITAMIN D3) 2000 units TABS Take 1 tablet by mouth daily.  . Fluticasone Propionate (XHANCE) 93 MCG/ACT EXHU Place 2 puffs into the nose 2 (two) times daily.  . rosuvastatin (CRESTOR) 40 MG tablet TAKE 1 TABLET BY MOUTH EVERY DAY  . sertraline (ZOLOFT) 50 MG tablet Take 1 tablet (50 mg total) by mouth daily.  Marland Kitchen SYNTHROID 75 MCG tablet TAKE 1 TABLET DAILY BEFORE BREAKFAST   No facility-administered encounter medications on file as of 04/16/2019.    Family History  Problem Relation Age of Onset  .  Hypertension Father   . Heart disease Father        He died at 1 from MI   . Stroke Mother   . Dementia Mother   . Breast cancer Mother   . Cancer Brother        thyroid  . Diabetes Brother   . Hypertension Brother   . Asthma Son   . Allergic rhinitis Son   . Asthma Daughter   . Allergic rhinitis Daughter   . Colon cancer Paternal Uncle        early 29s   Social History   Socioeconomic  History  . Marital status: Married    Spouse name: Not on file  . Number of children: Not on file  . Years of education: Not on file  . Highest education Small: Not on file  Occupational History  . Not on file  Social Needs  . Financial resource strain: Not on file  . Food insecurity    Worry: Not on file    Inability: Not on file  . Transportation needs    Medical: Not on file    Non-medical: Not on file  Tobacco Use  . Smoking status: Never Smoker  . Smokeless tobacco: Current User    Types: Snuff  Substance and Sexual Activity  . Alcohol use: No  . Drug use: No  . Sexual activity: Not on file  Lifestyle  . Physical activity    Days per week: Not on file    Minutes per session: Not on file  . Stress: Not on file  Relationships  . Social Herbalist on phone: Not on file    Gets together: Not on file    Attends religious service: Not on file    Active member of club or organization: Not on file    Attends meetings of clubs or organizations: Not on file    Relationship status: Not on file  . Intimate partner violence    Fear of current or ex partner: Not on file    Emotionally abused: Not on file    Physically abused: Not on file    Forced sexual activity: Not on file  Other Topics Concern  . Not on file  Social History Narrative   Marital Status: Married Elijah Small)   Children: Son (1), Daughters (2)    Pets: Dogs (4)     Living Situation: Lives with wife and kids.   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  PHYSICAL EXAM: BP (!) 148/72   Pulse 85   Temp 98.5 F (36.9 C)   Ht 5\' 9"  (1.753 m)   Wt 258 lb (117 kg)   BMI 38.10 kg/m    General: Well developed 53 year old male in no acute distress Head: Normocephalic and atraumatic Eyes:  Sclerae anicteric, conjunctive pink. Ears: Normal auditory acuity Mouth: Dentition intact. No ulcers or lesions.  Neck: Supple, no masses.  Lungs: Clear throughout to auscultation Heart: Regular rate and rhythm Abdomen: Soft, nontender, non distended. No masses or hepatomegaly noted. Normal bowel sounds  x 4 quads.  Laparoscopic scars intact. Rectal: Deferred. Musculoskeletal: Symmetrical with no gross deformities  Skin: No lesions on visible extremities Extremities: No edema  Neurological: Alert oriented x 4, grossly nonfocal Cervical Nodes:  No significant cervical adenopathy Psychological:  Alert and cooperative. Normal mood and affect  ASSESSMENT AND PLAN:  28. 53 year old male with a history of adenomatous colon polyps -Colonoscopy benefits and risks discussed including risks with sedation, risk of bleeding, infection and perforation  -Further follow-up to be determined after colonoscopy completed  2. History of diverticulitis with microperforation, s/p sigmoid resection 2009  3.  Family history of colon polyps    CC:  Danford, Berna Spare, NP

## 2019-04-16 ENCOUNTER — Encounter: Payer: Self-pay | Admitting: Nurse Practitioner

## 2019-04-16 ENCOUNTER — Ambulatory Visit (INDEPENDENT_AMBULATORY_CARE_PROVIDER_SITE_OTHER): Payer: 59 | Admitting: Nurse Practitioner

## 2019-04-16 VITALS — BP 148/72 | HR 85 | Temp 98.5°F | Ht 69.0 in | Wt 258.0 lb

## 2019-04-16 DIAGNOSIS — Z86010 Personal history of colonic polyps: Secondary | ICD-10-CM

## 2019-04-16 DIAGNOSIS — Z9049 Acquired absence of other specified parts of digestive tract: Secondary | ICD-10-CM

## 2019-04-16 DIAGNOSIS — Z1159 Encounter for screening for other viral diseases: Secondary | ICD-10-CM

## 2019-04-16 DIAGNOSIS — Z8371 Family history of colonic polyps: Secondary | ICD-10-CM | POA: Diagnosis not present

## 2019-04-16 MED ORDER — NA SULFATE-K SULFATE-MG SULF 17.5-3.13-1.6 GM/177ML PO SOLN: 1.0000 | Freq: Once | ORAL | 0 refills | Status: AC

## 2019-04-16 NOTE — Progress Notes (Signed)
Reviewed and agree with management plan.  Andreea Arca T. Othman Masur, MD FACG [facility]  

## 2019-04-16 NOTE — Patient Instructions (Signed)
If you are age 53 or older, your body mass index should be between 23-30. Your Body mass index is 38.1 kg/m. If this is out of the aforementioned range listed, please consider follow up with your Primary Care Provider.  If you are age 88 or younger, your body mass index should be between 19-25. Your Body mass index is 38.1 kg/m. If this is out of the aformentioned range listed, please consider follow up with your Primary Care Provider.   You have been scheduled for a colonoscopy. Please follow written instructions given to you at your visit today.  Please pick up your prep supplies at the pharmacy within the next 1-3 days. If you use inhalers (even only as needed), please bring them with you on the day of your procedure.  We have sent the following medications to your pharmacy for you to pick up at your convenience:  suprep  Thank you for choosing me and [facility]

## 2019-04-30 ENCOUNTER — Encounter: Payer: Self-pay | Admitting: Adult Health

## 2019-05-01 ENCOUNTER — Other Ambulatory Visit: Payer: Self-pay

## 2019-05-01 DIAGNOSIS — G4730 Sleep apnea, unspecified: Secondary | ICD-10-CM

## 2019-05-08 ENCOUNTER — Other Ambulatory Visit: Payer: Self-pay | Admitting: Adult Health

## 2019-05-20 ENCOUNTER — Encounter: Payer: Self-pay | Admitting: Adult Health

## 2019-05-21 ENCOUNTER — Other Ambulatory Visit: Payer: Self-pay | Admitting: Adult Health

## 2019-06-04 ENCOUNTER — Telehealth: Payer: Self-pay | Admitting: Adult Health

## 2019-06-04 NOTE — Telephone Encounter (Signed)
Elijah Small @ [place] [employer] (p) [phone] (f) [phone]  that supply Cpap machines (pt has requested a new machine).  --Adapt needs :  Pulmonogist OV notes regarding Sleep study ( we do not have).  Adapt needs : Pt's current Cpap recorded reading report (we do not have)  --Forwarding inquiry to referral coord , do not see who pt was referred to for testing or will contact pt to see who was the Provider for requested information.  --glh

## 2019-06-05 ENCOUNTER — Other Ambulatory Visit: Payer: Self-pay | Admitting: Adult Health

## 2019-06-05 DIAGNOSIS — G4730 Sleep apnea, unspecified: Secondary | ICD-10-CM

## 2019-06-05 DIAGNOSIS — E669 Obesity, unspecified: Secondary | ICD-10-CM

## 2019-06-05 NOTE — Telephone Encounter (Signed)
Please review the below and advise. AS, CMA 

## 2019-06-05 NOTE — Telephone Encounter (Signed)
Good Otilio Jefferson, New referral to Pulmonology placed, re: OSA. Thanks! Valetta Fuller

## 2019-06-07 ENCOUNTER — Encounter: Payer: 59 | Admitting: Gastroenterology

## 2019-06-22 ENCOUNTER — Other Ambulatory Visit: Payer: Self-pay | Admitting: Allergy & Immunology

## 2019-06-27 ENCOUNTER — Other Ambulatory Visit: Payer: Self-pay | Admitting: Adult Health

## 2019-06-27 MED ORDER — SERTRALINE HCL 50 MG PO TABS: 50.0000 mg | ORAL_TABLET | Freq: Every day | ORAL | 0 refills | Status: DC

## 2019-06-27 NOTE — Telephone Encounter (Signed)
Patient is aware of the medication refill policy and is aware that I can only give him 15 tabs since we have already given him the 30. Patient transferred to front desk to schedule apt sooner for med refills.  Med sent to pharmacy. AS, CMA

## 2019-06-27 NOTE — Telephone Encounter (Signed)
Katy[facility]  Patient called to make appt for CPE & FBW, knows Rx refills tied to appt with provider & scheduled for 1st available CPE 08/07/2019.   ---pls advise if refill can be given till appt/ CPE or if pt needs to do a TELEHEALTH/ DOXY for refill approval.   Pt uses:  [id number] - [place], [place] - [address] [phone] (Phone) [phone] (Fax     --glh

## 2019-06-29 ENCOUNTER — Other Ambulatory Visit: Payer: Self-pay | Admitting: Adult Health

## 2019-07-09 ENCOUNTER — Other Ambulatory Visit: Payer: Self-pay

## 2019-07-09 ENCOUNTER — Ambulatory Visit (INDEPENDENT_AMBULATORY_CARE_PROVIDER_SITE_OTHER): Payer: 59 | Admitting: Family Medicine

## 2019-07-09 ENCOUNTER — Encounter: Payer: Self-pay | Admitting: Family Medicine

## 2019-07-09 DIAGNOSIS — Z6836 Body mass index (BMI) 36.0-36.9, adult: Secondary | ICD-10-CM

## 2019-07-09 DIAGNOSIS — E785 Hyperlipidemia, unspecified: Secondary | ICD-10-CM

## 2019-07-09 DIAGNOSIS — E786 Lipoprotein deficiency: Secondary | ICD-10-CM

## 2019-07-09 DIAGNOSIS — R7303 Prediabetes: Secondary | ICD-10-CM

## 2019-07-09 DIAGNOSIS — F419 Anxiety disorder, unspecified: Secondary | ICD-10-CM

## 2019-07-09 DIAGNOSIS — E039 Hypothyroidism, unspecified: Secondary | ICD-10-CM

## 2019-07-09 MED ORDER — ROSUVASTATIN CALCIUM 40 MG PO TABS: 40.0000 mg | ORAL_TABLET | Freq: Every day | ORAL | 0 refills | Status: DC

## 2019-07-09 MED ORDER — SERTRALINE HCL 50 MG PO TABS: 50.0000 mg | ORAL_TABLET | Freq: Every day | ORAL | 0 refills | Status: DC

## 2019-07-09 MED ORDER — LEVOTHYROXINE SODIUM 75 MCG PO TABS: 75.0000 ug | ORAL_TABLET | Freq: Every day | ORAL | 0 refills | Status: DC

## 2019-07-09 MED ORDER — SERTRALINE HCL 50 MG PO TABS: 25.0000 mg | ORAL_TABLET | Freq: Every day | ORAL | 0 refills | Status: DC

## 2019-07-09 NOTE — Progress Notes (Signed)
Telehealth office visit note for Elijah Small, D.O- at Primary Care at [facility]   I connected with current patient today and verified that I am speaking with the correct person using two identifiers.   . Location of the patient: Home . Location of the provider: Office Only the patient (+/- their family members at pt's discretion) and myself were participating in the encounter - This visit type was conducted due to national recommendations for restrictions regarding the COVID-19 Pandemic (e.g. social distancing) in an effort to limit this patient's exposure and mitigate transmission in our community.  This format is felt to be most appropriate for this patient at this time.   - No physical exam could be performed with this format, beyond that communicated to [place] by the patient/ family members as noted.   - Additionally my office staff/ schedulers discussed with the patient that there may be a monetary charge related to this service, depending on their medical insurance.   The patient expressed understanding, and agreed to proceed.     _________________________________________________________________________________   History of Present Illness: Hypothyroidism and Hyperlipidemia   I, Elijah Small, am serving as Education administrator for [employer].  Notes he is calling today for his six month checkup.  Says normally he would have blood work done this time of year, and he needs prescription refills.  He did not return for his physical as discussed prior because his mother passed away in 2022/05/25.  She was age 44.  Notes she had dementia and Alzheimer's and was in assisted living.  - Mood Management Feels his mood is fine on Zoloft.  He's been taking this medication for years.  Says "I really don't know if I need it anymore, but I just haven't gone off of it."  Denies concerns with anxiety today.  HPI:  Hyperlipidemia:  54 y.o. male here for cholesterol follow-up.   - Patient  reports good compliance with treatment plan of:  medication and/ or lifestyle management.    - Patient denies any acute concerns or problems with management plan.  He takes his Crestor in the morning.  - He denies new onset of: myalgias, arthralgias, increased fatigue more than normal, chest pains, exercise intolerance, shortness of breath, dizziness, visual changes, headache, lower extremity swelling or claudication.   Most recent cholesterol panel was:  Lab Results  Component Value Date   CHOL 133 06/23/2018   HDL 36 (L) 06/23/2018   LDLCALC 76 06/23/2018   TRIG 105 06/23/2018   CHOLHDL 3.7 06/23/2018   Hepatic Function Latest Ref Rng & Units 06/23/2018 06/28/2017 06/10/2016  Total Protein 6.0 - 8.5 g/dL 7.1 6.5 7.1  Albumin 3.8 - 4.9 g/dL 4.4 4.2 4.6  AST 0 - 40 IU/L _0 ALT 0 - 44 IU/L 34 26 37  Alk Phosphatase 39 - 117 IU/L 72 58 65  Total Bilirubin 0.0 - 1.2 mg/dL 0.5 0.6 0.5  Bilirubin, Direct - - - -       GAD 7 : Generalized Anxiety Score 07/09/2019 11/22/2018 12/14/2016  Nervous, Anxious, on Edge 0 0 0  Control/stop worrying 0 0 0  Worry too much - different things 0 0 0  Trouble relaxing 0 0 0  Restless 0 0 0  Easily annoyed or irritable 0 0 0  Afraid - awful might happen 0 0 0  Total GAD 7 Score 0 0 0  Anxiety Difficulty Not difficult at all - Not difficult at all  Depression screen [facility] 2/9 07/09/2019 11/22/2018 05/08/2018 09/07/2017 03/22/2017  Decreased Interest 0 0 0 0 0  Down, Depressed, Hopeless 0 0 0 0 0  PHQ - 2 Score 0 0 0 0 0  Altered sleeping 0 0 0 0 0  Tired, decreased energy 0 0 0 0 0  Change in appetite 0 0 0 0 0  Feeling bad or failure about yourself  0 0 0 0 0  Trouble concentrating 0 0 0 0 0  Moving slowly or fidgety/restless 0 0 0 0 0  Suicidal thoughts 0 0 0 0 0  PHQ-9 Score 0 0 0 0 0      Impression and Recommendations:    1. Morbid obesity ([place])   2. Hyperlipidemia, unspecified hyperlipidemia type   3. Low HDL (under 40)   4.  Acquired hypothyroidism   5. Anxiety   6. h/o Prediabetes      - Last OV 11/22/2018. - Last labs drawn 06/23/2018.  Need for repeat lab work.  See orders.   Anxiety - Stable, well-controlled at this time. - As patient's anxiety is so well-controlled, patient requested to lower the dose to 25 mg Zoloft, a half tablet daily, reduced from prior dose of 23m QD.  See med list.  - Reviewed the "spokes of the wheel" of mood and health management.  Stressed the importance of ongoing prudent habits, including regular exercise, appropriate sleep hygiene, healthful dietary habits, and prayer/meditation to relax.  - Patient knows to call in with any concerns as desired.  - Will continue to monitor.   Hyperlipidemia, Low HDL - FLP stable last check one year ago. - Need for re-check near future. - Patient tolerating meds well without S-E.  - Continue treatment plan as established.  See med list. - Advised patient to take his Crestor before bedtime.  - Prudent dietary changes such as low saturated & trans fat diets for hyperlipidemia and low carb diets for hypertriglyceridemia discussed with patient.    - Encouraged patient to follow AHA guidelines for regular exercise and also engage in weight loss if BMI above 25.   - Educational handouts provided at patient's desire and/ or told to look [place] the [place] information.  - We will continue to monitor and re-check as discussed.  Acquired Hypothyroidism - Stable on current treatment plan. - Patient is asymptomatic. - Continue management as established.  See med list. - Will continue to monitor and re-check as discussed.  History of Prediabetes - Family Hx Diabetes in Brother - 5.9, elevated last check, down from 6.1 prior.  - Counseled patient on prevention of diabetes and discussed dietary and lifestyle modification as first line.    - Importance of low carb, heart-healthy diet discussed with  patient in addition to regular aerobic exercise of 333m 5d/week or more.   - Encouraged patient to engage in prudent weight loss to help reduce his risk of diabetes.  - Will continue to monitor and re-check as discussed.  BMI Counseling - Morbid Obesity, Body mass index is 36.62 kg/m Explained to patient what BMI refers to, and what it means medically.    Told patient to think about it as a "medical risk stratification measurement" and how increasing BMI is associated with increasing risk/ or worsening state of various diseases such as hypertension, hyperlipidemia, diabetes, premature OA, depression etc.  American Heart Association guidelines for healthy diet, basically Mediterranean diet, and exercise guidelines of 30 minutes 5 days per  week or more discussed in detail.  Health counseling performed.  All questions answered.  Lifestyle & Preventative Health Maintenance - Advised patient to continue working toward exercising to improve overall mental, physical, and emotional health.    - Encouraged patient to engage in daily physical activity as tolerated, especially a formal exercise routine.  Recommended that the patient eventually strive for at least 150 minutes of moderate cardiovascular activity per week according to guidelines established by the [facility].   - Healthy dietary habits encouraged, including low-carb, and high amounts of lean protein in diet.   - Patient should also consume adequate amounts of water.  Recommendations - Return near future for CPE and re-check A1c, cholesterol, thyroid   - As part of my medical decision making, I reviewed the following data within the [place] History obtained from pt /family, CMA notes reviewed and incorporated if applicable, Labs reviewed, Radiograph/ tests reviewed if applicable and OV notes from prior OV's with me, as well as other specialists she/he has seen since seeing me last, were all reviewed and used in my medical  decision making process today.    - Additionally, discussion had with patient regarding our treatment plan, and their biases/concerns about that plan were used in my medical decision making today.    - The patient agreed with the plan and demonstrated an understanding of the instructions.   No barriers to understanding were identified.     Return for Has follow-up for CPE and fasting blood work near future already scheduled.    Meds ordered this encounter  Medications  . DISCONTD: sertraline (ZOLOFT) 50 MG tablet    Sig: Take 1 tablet (50 mg total) by mouth daily.    Dispense:  90 tablet    Refill:  0  . DISCONTD: rosuvastatin (CRESTOR) 40 MG tablet    Sig: Take 1 tablet (40 mg total) by mouth daily.    Dispense:  90 tablet    Refill:  0  . levothyroxine (SYNTHROID) 75 MCG tablet    Sig: Take 1 tablet (75 mcg total) by mouth daily before breakfast.    Dispense:  90 tablet    Refill:  0  . sertraline (ZOLOFT) 50 MG tablet    Sig: Take 0.5 tablets (25 mg total) by mouth daily.    Dispense:  90 tablet    Refill:  0  . rosuvastatin (CRESTOR) 40 MG tablet    Sig: Take 1 tablet (40 mg total) by mouth at bedtime.    Dispense:  90 tablet    Refill:  0    Medications Discontinued During This Encounter  Medication Reason  . rosuvastatin (CRESTOR) 40 MG tablet Reorder  . sertraline (ZOLOFT) 50 MG tablet Reorder  . levothyroxine (SYNTHROID) 75 MCG tablet Reorder  . sertraline (ZOLOFT) 50 MG tablet   . rosuvastatin (CRESTOR) 40 MG tablet      Time spent on visit including pre-visit chart review and post-visit care was 13+ minutes.  Note:  This note was prepared with assistance of Dragon voice recognition software. Occasional wrong-word or sound-a-like substitutions may have occurred due to the inherent limitations of voice recognition software.   The [place] was signed into law in 2016 which includes the topic of electronic health records.  This provides immediate  access to information in MyChart.  This includes consultation notes, operative notes, office notes, lab results and pathology reports.  If you have any questions about what you read please let  us know at your next visit or call us at the office.  We are right here with you.  This document serves as a record of services personally performed by Elijah Dance, DO. It was created on her behalf by Elijah Small, a trained medical scribe. The creation of this record is based on the scribe's personal observations and the provider's statements to them.   This case required medical decision making of at least moderate complexity. The above documentation from Elijah Small, medical scribe, has been reviewed by Marjory Sneddon, D.O.    __________________________________________________________________________________     Patient Care Team    Relationship Specialty Notifications Start End  Esaw Grandchild, NP PCP - General Family Medicine  12/10/16   Tanda Rockers, MD Consulting Physician Pulmonary Disease  06/05/19      -Vitals obtained; medications/ allergies reconciled;  personal medical, social, Sx etc.histories were updated by CMA, reviewed by me and are reflected in chart   Patient Active Problem List   Diagnosis Date Noted  . Morbid obesity ([place]) 03/03/2014  . Hyperlipidemia 02/04/2013  . Prediabetes 07/09/2019  . Acquired hypothyroidism 06/10/2016  . Gastroesophageal reflux disease without esophagitis 03/03/2014  . Anxiety 02/04/2013  . Low HDL (under 40) 07/09/2019  . Exercise induced bronchospasm 08/04/2018  . Perennial allergic rhinitis 08/04/2018  . Family history of prostate cancer in father 05/08/2018  . Cough variant asthma 09/22/2017  . Cough in adult 09/07/2017  . Wheezing 09/07/2017  . Screening for colon cancer 03/22/2017  . Healthcare maintenance 03/22/2017  . Atypical mole 12/14/2016  . BPPV (benign paroxysmal positional vertigo) 12/14/2016  . Other  fatigue 06/10/2016  . Family history of diabetes mellitus 06/10/2016  . Need for influenza vaccination 06/10/2016  . Need for prophylactic vaccination with combined diphtheria-tetanus-pertussis (DTP) vaccine 08/06/2013  . Diverticulitis   . Sleep apnea   . History of colon polyps      Current Meds  Medication Sig  . albuterol (PROVENTIL HFA;VENTOLIN HFA) 108 (90 Base) MCG/ACT inhaler Inhale 2 puffs into the lungs every 6 (six) hours as needed for wheezing or shortness of breath.  . Cholecalciferol (VITAMIN D3) 2000 units TABS Take 1 tablet by mouth daily.  . Fluticasone Propionate (XHANCE) 93 MCG/ACT EXHU Place 2 puffs into the nose 2 (two) times daily.  Marland Kitchen levothyroxine (SYNTHROID) 75 MCG tablet Take 1 tablet (75 mcg total) by mouth daily before breakfast.  . rosuvastatin (CRESTOR) 40 MG tablet Take 1 tablet (40 mg total) by mouth at bedtime.  . sertraline (ZOLOFT) 50 MG tablet Take 0.5 tablets (25 mg total) by mouth daily.  . [DISCONTINUED] levothyroxine (SYNTHROID) 75 MCG tablet Take 1 tablet (75 mcg total) by mouth daily before breakfast. **PATIENT NEEDS APT FOR FURTHER REFILLS**  . [DISCONTINUED] rosuvastatin (CRESTOR) 40 MG tablet Take 1 tablet (40 mg total) by mouth daily. **PATIENT NEEDS APT FOR FURTHER REFILLS**  . [DISCONTINUED] rosuvastatin (CRESTOR) 40 MG tablet Take 1 tablet (40 mg total) by mouth daily.  . [DISCONTINUED] sertraline (ZOLOFT) 50 MG tablet Take 1 tablet (50 mg total) by mouth daily. OFFICE VISIT REQUIRED PRIOR TO ANY FURTHER REFILLS  . [DISCONTINUED] sertraline (ZOLOFT) 50 MG tablet Take 1 tablet (50 mg total) by mouth daily.     Allergies:  No Known Allergies   ROS:  See above HPI for pertinent positives and negatives   Objective:   Blood pressure 132/67, pulse 83, temperature 98.6 F (37 C), temperature source Oral, height _0  (1.753 m), weight 248  lb (112.5 kg).  (if some vitals are omitted, this means that patient was UNABLE to obtain them even  though they were asked to get them prior to OV today.  They were asked to call us at their earliest convenience with these once obtained. )  General: A & O * 3; sounds in no acute distress; in usual state of health.  Skin: Pt confirms warm and dry extremities and pink fingertips HEENT: Pt confirms lips non-cyanotic Chest: Patient confirms normal chest excursion and movement Respiratory: speaking in full sentences, no conversational dyspnea; patient confirms no use of accessory muscles Psych: insight appears good, mood- appears full

## 2019-07-09 NOTE — Patient Instructions (Signed)
Nine ways to increase your "good" HDL cholesterol  High-density lipoprotein (HDL) is often referred to as the "good" cholesterol. Having high HDL levels helps carry cholesterol from your arteries to your liver, where it can be used or excreted.  Having high levels of HDL also has antioxidant and anti-inflammatory effects, and is linked to a reduced risk of heart disease (1, 2).  Most health experts recommend minimum blood levels of 40 mg/dl in men and 50 mg/dl in women.  While genetics definitely play a role, there are several other factors that affect HDL levels.  Here are nine healthy ways to raise your "good" HDL cholesterol.  1. Consume olive oil  two pieces of salmon on a plate olive oil being poured into a small dish Extra virgin olive oil may be more healthful than processed olive oils. Olive oil is one of the healthiest fats around.  A large analysis of 42 studies with more than 800,000 participants found that olive oil was the only source of monounsaturated fat that seemed to reduce heart disease risk (3).  Research has shown that one of olive oil's heart-healthy effects is an increase in HDL cholesterol. This effect is thought to be caused by antioxidants it contains called polyphenols (4, 5, 6, 7).  Extra virgin olive oil has more polyphenols than more processed olive oils, although the amount can still vary among different types and brands.  One study gave 200 healthy young men about 2 tablespoons (25 ml) of different olive oils per day for three weeks.  The researchers found that participants' HDL levels increased significantly more after they consumed the olive oil with the highest polyphenol content (6).  In another study, when 69 older adults consumed about 4 tablespoons (50 ml) of high-polyphenol extra virgin olive oil every day for six weeks, their HDL cholesterol increased by 6.5 mg/dl, on average (7).  In addition to raising HDL levels, olive oil has been found to  boost HDL's anti-inflammatory and antioxidant function in studies of older people and individuals with high cholesterol levels ( 7, 8, 9).  Whenever possible, select high-quality, certified extra virgin olive oils, which tend to be highest in polyphenols.  Bottom line: Extra virgin olive oil with a high polyphenol content has been shown to increase HDL levels in healthy people, the elderly and individuals with high cholesterol.  2. Follow a low-carb or ketogenic diet  Low-carb and ketogenic diets provide a number of health benefits, including weight loss and reduced blood sugar levels.  They have also been shown to increase HDL cholesterol in people who tend to have lower levels.  This includes those who are obese, insulin resistant or diabetic (10, 11, 12, 13, 14, 15, 16, 17).  In one study, people with type 2 diabetes were split into two groups.  One followed a diet consuming less than 50 grams of carbs per day. The other followed a high-carb diet.  Although both groups lost weight, the low-carb group's HDL cholesterol increased almost twice as much as the high-carb group's did (14).  In another study, obese people who followed a low-carb diet experienced an increase in HDL cholesterol of 5 mg/dl overall.  Meanwhile, in the same study, the participants who ate a low-fat, high-carb diet showed a decrease in HDL cholesterol (15).  This response may partially be due to the higher levels of fat people typically consume on low-carb diets.  One study in overweight women found that diets high in meat and cheese increased HDL levels  by 5-8%, compared to a higher-carb diet (18).  What's more, in addition to raising HDL cholesterol, very-low-carb diets have been shown to decrease triglycerides and improve several other risk factors for heart disease (13, 14, 16, 17).  Bottom line: Low-carb and ketogenic diets typically increase HDL cholesterol levels in people with diabetes, metabolic syndrome  and obesity.  3. Exercise regularly  Being physically active is important for heart health.  Studies have shown that many different types of exercise are effective at raising HDL cholesterol, including strength training, high-intensity exercise and aerobic exercise (19, 20, 21, 22, 23, 24).  However, the biggest increases in HDL are typically seen with high-intensity exercise.  One small study followed women who were living with polycystic ovary syndrome (PCOS), which is linked to a higher risk of insulin resistance. The study required them to perform high-intensity exercise three times a week.  The exercise led to an increase in HDL cholesterol of 8 mg/dL after 10 weeks. The women also showed improvements in other health markers, including decreased insulin resistance and improved arterial function (23).  In a 12-week study, overweight men who performed high-intensity exercise experienced a 10% increase in HDL cholesterol.  In contrast, the low-intensity exercise group showed only a 2% increase and the endurance training group experienced no change (24).  However, even lower-intensity exercise seems to increase HDL's anti-inflammatory and antioxidant capacities, whether or not HDL levels change (20, 21, 25).  Overall, high-intensity exercise such as high-intensity interval training (HIIT) and high-intensity circuit training (HICT) may boost HDL cholesterol levels the most.  Bottom line: Exercising several times per week can help raise HDL cholesterol and enhance its anti-inflammatory and antioxidant effects. High-intensity forms of exercise may be especially effective.  4. Add coconut oil to your diet  Studies have shown that coconut oil may reduce appetite, increase metabolic rate and help protect brain health, among other benefits.  Some people may be concerned about coconut oil's effects on heart health due to its high saturated fat content.  However, it appears that coconut oil is  actually quite heart healthy.  Coconut oil tends to raise HDL cholesterol more than many other types of fat.  In addition, it may improve the ratio of low-density-lipoprotein (LDL) cholesterol, the "bad" cholesterol, to HDL cholesterol. Improving this ratio reduces heart disease risk (26, 27, 28, 29).  One study examined the health effects of coconut oil on 45 women with excess belly fat. The researchers found that participants who took coconut oil daily experienced increased HDL cholesterol and a lower LDL-to-HDL ratio.  In contrast, the group who took soybean oil daily had a decrease in HDL cholesterol and an increase in the LDL-to-HDL ratio (29).  Most studies have found these health benefits occur at a dosage of about 2 tablespoons (30 ml) of coconut oil per day. It's best to incorporate this into cooking rather than eating spoonfuls of coconut oil on their own.  Bottom line: Consuming 2 tablespoons (30 ml) of coconut oil per day may help increase HDL cholesterol levels.  5. Stop smoking  cigarette butt Quitting smoking can reduce the risk of heart disease and lung cancer. Smoking increases the risk of many health problems, including heart disease and lung cancer (30).  One of its negative effects is a suppression of HDL cholesterol.  Some studies have found that quitting smoking can increase HDL levels. Indeed, one study found no significant differences in HDL levels between former smokers and people who had never smoked (31, 32,  33, 34, 35).  In a one-year study of more than 1,500 people, those who quit smoking had twice the increase in HDL as those who resumed smoking within the year. The number of large HDL particles also increased, which further reduced heart disease risk (32).  One study followed smokers who switched from traditional cigarettes to electronic cigarettes for one year. They found that the switch was associated with an increase in HDL cholesterol of 5 mg/dl, on  average (33).  When it comes to the effect of nicotine replacement patches on HDL levels, research results have been mixed.  One study found that nicotine replacement therapy led to higher HDL cholesterol. However, other research suggests that people who use nicotine patches likely won't see increases in HDL levels until after replacement therapy is completed (34, 36).  Even in studies where HDL cholesterol levels didn't increase after people quit smoking, HDL function improved, resulting in less inflammation and other beneficial effects on heart health (37).  Bottom line: Quitting smoking can increase HDL levels, improve HDL function and help protect heart health.  6. Lose weight  When overweight and obese people lose weight, their HDL cholesterol levels usually increase.  What's more, this benefit seems to occur whether weight loss is achieved by calorie counting, carb restriction, intermittent fasting, weight loss surgery or a combination of diet and exercise (16, 38, 39, 40, 41, 42).  One study examined HDL levels in more than 3,000 overweight and obese [place] adults who followed a lifestyle modification program for one year.  The researchers found that losing at least 6.6 lbs (3 kg) led to an increase in HDL cholesterol of 4 mg/dl, on average (41).  In another study, when obese people with type 2 diabetes consumed calorie-restricted diets that provided 20-30% of calories from protein, they experienced significant increases in HDL cholesterol levels (42).  The key to achieving and maintaining healthy HDL cholesterol levels is choosing the type of diet that makes it easiest for you to lose weight and keep it off.  Bottom Line: Several methods of weight loss have been shown to increase HDL cholesterol levels in people who are overweight or obese.  7. Choose purple produce  Consuming purple-colored fruits and vegetables is a delicious way to potentially increase HDL  cholesterol.  Purple produce contains antioxidants known as anthocyanins.  Studies using anthocyanin extracts have shown that they help fight inflammation, protect your cells from damaging free radicals and may also raise HDL cholesterol levels (43, 44, 45, 46).  In a 24-week study of 31 people with diabetes, those who took an anthocyanin supplement twice a day experienced a 19% increase in HDL cholesterol, on average, along with other improvements in heart health markers (45).  In another study, when people with cholesterol issues took anthocyanin extract for 12 weeks, their HDL cholesterol levels increased by 13.7% (46).  Although these studies used extracts instead of foods, there are several fruits and vegetables that are very high in anthocyanins. These include eggplant, purple corn, red cabbage, blueberries, blackberries and black raspberries.  Bottom line: Consuming fruits and vegetables rich in anthocyanins may help increase HDL cholesterol levels.  8. Eat fatty fish often  The omega-3 fats in fatty fish provide major benefits to heart health, including a reduction in inflammation and better functioning of the cells that line your arteries (47, 48).  There's some research showing that eating fatty fish or taking fish oil may also help raise low levels of HDL cholesterol (49, 50, 51,  52, 53).  In a study of 33 heart disease patients, participants that consumed fatty fish four times per week experienced an increase in HDL cholesterol levels. The particle size of their HDL also increased (52).  In another study, overweight men who consumed herring five days a week for six weeks had a 5% increase in HDL cholesterol, compared with their levels after eating lean pork and chicken five days a week (53).  However, there are a few studies that found no increase in HDL cholesterol in response to increased fish or omega-3 supplement intake (54, 55).  In addition to herring, other types of fatty  fish that may help raise HDL cholesterol include salmon, sardines, mackerel and anchovies.  Bottom line: Eating fatty fish several times per week may help increase HDL cholesterol levels and provide other benefits to heart health.  9. Avoid artificial trans fats  Artificial trans fats have many negative health effects due to their inflammatory properties (56, 57).  There are two types of trans fats. One kind occurs naturally in animal products, including full-fat dairy.  In contrast, the artificial trans fats found in margarines and processed foods are created by adding hydrogen to unsaturated vegetable and seed oils. These fats are also known as industrial trans fats or partially hydrogenated fats.  Research has shown that, in addition to increasing inflammation and contributing to several health problems, these artificial trans fats may lower HDL cholesterol levels.  In one study, researchers compared how people's HDL levels responded when they consumed different margarines.  The study found that participants' HDL cholesterol levels were 10% lower after consuming margarine containing partially hydrogenated soybean oil, compared to their levels after consuming palm oil (58).  Another controlled study followed 40 adults who had diets high in different types of trans fats.  They found that HDL cholesterol levels in women were significantly lower after they consumed the diet high in industrial trans fats, compared to the diet containing naturally occurring trans fats (59).  To protect heart health and keep HDL cholesterol in the healthy range, it's best to avoid artificial trans fats altogether.  Bottom line: Artificial trans fats have been shown to lower HDL levels and increase inflammation, compared to other fats.  Take home message  Although your HDL cholesterol levels are partly determined by your genetics, there are many things you can do to naturally increase your own  levels.  Fortunately, the practices that raise HDL cholesterol often provide other health benefits as well.

## 2019-07-16 ENCOUNTER — Telehealth: Payer: Self-pay

## 2019-07-16 MED ORDER — XHANCE 93 MCG/ACT NA EXHU: 2.0000 | INHALANT_SUSPENSION | Freq: Two times a day (BID) | NASAL | 0 refills | Status: DC

## 2019-07-16 NOTE — Telephone Encounter (Signed)
Fax from pharmacy to request refill for [facility].  Pt has not been seen since 07/2018.  Call to patient to schedule f/u appointment. Reminded patient to keep appointment.  One courtesy refill sent to [facility].

## 2019-07-18 ENCOUNTER — Other Ambulatory Visit: Payer: Self-pay | Admitting: Allergy & Immunology

## 2019-07-29 ENCOUNTER — Other Ambulatory Visit: Payer: Self-pay | Admitting: Family Medicine

## 2019-07-29 DIAGNOSIS — E039 Hypothyroidism, unspecified: Secondary | ICD-10-CM

## 2019-08-07 ENCOUNTER — Other Ambulatory Visit: Payer: Self-pay

## 2019-08-07 ENCOUNTER — Ambulatory Visit (INDEPENDENT_AMBULATORY_CARE_PROVIDER_SITE_OTHER): Payer: 59 | Admitting: Family Medicine

## 2019-08-07 ENCOUNTER — Encounter: Payer: Self-pay | Admitting: Family Medicine

## 2019-08-07 ENCOUNTER — Other Ambulatory Visit: Payer: 59

## 2019-08-07 VITALS — BP 117/79 | HR 90 | Temp 98.0°F | Resp 12 | Ht 69.0 in | Wt 256.9 lb

## 2019-08-07 DIAGNOSIS — E039 Hypothyroidism, unspecified: Secondary | ICD-10-CM

## 2019-08-07 DIAGNOSIS — Z8042 Family history of malignant neoplasm of prostate: Secondary | ICD-10-CM

## 2019-08-07 DIAGNOSIS — Z9049 Acquired absence of other specified parts of digestive tract: Secondary | ICD-10-CM | POA: Insufficient documentation

## 2019-08-07 DIAGNOSIS — Z719 Counseling, unspecified: Secondary | ICD-10-CM

## 2019-08-07 DIAGNOSIS — Z0000 Encounter for general adult medical examination without abnormal findings: Secondary | ICD-10-CM | POA: Diagnosis not present

## 2019-08-07 DIAGNOSIS — R7303 Prediabetes: Secondary | ICD-10-CM

## 2019-08-07 DIAGNOSIS — H538 Other visual disturbances: Secondary | ICD-10-CM | POA: Diagnosis not present

## 2019-08-07 DIAGNOSIS — C44622 Squamous cell carcinoma of skin of right upper limb, including shoulder: Secondary | ICD-10-CM | POA: Insufficient documentation

## 2019-08-07 DIAGNOSIS — R5383 Other fatigue: Secondary | ICD-10-CM

## 2019-08-07 DIAGNOSIS — E785 Hyperlipidemia, unspecified: Secondary | ICD-10-CM

## 2019-08-07 DIAGNOSIS — Z833 Family history of diabetes mellitus: Secondary | ICD-10-CM

## 2019-08-07 MED ORDER — LEVOTHYROXINE SODIUM 75 MCG PO TABS: 75.0000 ug | ORAL_TABLET | Freq: Every day | ORAL | 0 refills | Status: DC

## 2019-08-07 NOTE — Patient Instructions (Addendum)
Please call to schedule your colonoscopy as soon as possible.   For ear care, please avoid using Q-tips or otherwise inserting any objects into the ear.  Instead, utilize a mixture of half hydrogen peroxide, half rubbing alcohol in a 1:1 solution.     Preventive Care 69 Years and Older, Male Preventive care refers to lifestyle choices and visits with your health care provider that can promote health and wellness. What does preventive care include?   A yearly physical exam. This is also called an annual well check.  Dental exams once or twice a year.  Routine eye exams. Ask your health care provider how often you should have your eyes checked.  Personal lifestyle choices, including: ? Daily care of your teeth and gums. ? Regular physical activity. ? Eating a healthy diet. ? Avoiding tobacco and drug use. ? Limiting alcohol use. ? Practicing safe sex. ? Taking low doses of aspirin every day. ? Taking vitamin and mineral supplements as recommended by your health care provider. What happens during an annual well check? The services and screenings done by your health care provider during your annual well check will depend on your age, overall health, lifestyle risk factors, and family history of disease. Counseling Your health care provider may ask you questions about your:  Alcohol use.  Tobacco use.  Drug use.  Emotional well-being.  Home and relationship well-being.  Sexual activity.  Eating habits.  History of falls.  Memory and ability to understand (cognition).  Work and work Statistician. Screening You may have the following tests or measurements:  Height, weight, and BMI.  Blood pressure.  Lipid and cholesterol levels. These may be checked every 5 years, or more frequently if you are over 45 years old.  Skin check.  Lung cancer screening. You may have this screening every year starting at age 22 if you have a 30-pack-year history of smoking and  currently smoke or have quit within the past 15 years.  Colorectal cancer screening. All adults should have this screening starting at age 73 and continuing until age 101. You will have tests every 1-10 years, depending on your results and the type of screening test. People at increased risk should start screening at an earlier age. Screening tests may include: ? Guaiac-based fecal occult blood testing. ? Fecal immunochemical test (FIT). ? Stool DNA test. ? Virtual colonoscopy. ? Sigmoidoscopy. During this test, a flexible tube with a tiny camera (sigmoidoscope) is used to examine your rectum and lower colon. The sigmoidoscope is inserted through your anus into your rectum and lower colon. ? Colonoscopy. During this test, a long, thin, flexible tube with a tiny camera (colonoscope) is used to examine your entire colon and rectum.  Prostate cancer screening. Recommendations will vary depending on your family history and other risks.  Hepatitis C blood test.  Hepatitis B blood test.  Sexually transmitted disease (STD) testing.  Diabetes screening. This is done by checking your blood sugar (glucose) after you have not eaten for a while (fasting). You may have this done every 1-3 years.  Abdominal aortic aneurysm (AAA) screening. You may need this if you are a current or former smoker.  Osteoporosis. You may be screened starting at age 69 if you are at high risk. Talk with your health care provider about your test results, treatment options, and if necessary, the need for more tests. Vaccines Your health care provider may recommend certain vaccines, such as:  Influenza vaccine. This is recommended every year.  Tetanus, diphtheria, and acellular pertussis (Tdap, Td) vaccine. You may need a Td booster every 10 years.  Varicella vaccine. You may need this if you have not been vaccinated.  Zoster vaccine. You may need this after age 75.  Measles, mumps, and rubella (MMR) vaccine. You may  need at least one dose of MMR if you were born in 1957 or later. You may also need a second dose.  Pneumococcal 13-valent conjugate (PCV13) vaccine. One dose is recommended after age 69.  Pneumococcal polysaccharide (PPSV23) vaccine. One dose is recommended after age 66.  Meningococcal vaccine. You may need this if you have certain conditions.  Hepatitis A vaccine. You may need this if you have certain conditions or if you travel or work in places where you may be exposed to hepatitis A.  Hepatitis B vaccine. You may need this if you have certain conditions or if you travel or work in places where you may be exposed to hepatitis B.  Haemophilus influenzae type b (Hib) vaccine. You may need this if you have certain risk factors. Talk to your health care provider about which screenings and vaccines you need and how often you need them. This information is not intended to replace advice given to you by your health care provider. Make sure you discuss any questions you have with your health care provider. Document Released: 05/23/2015 Document Revised: 06/16/2017 Document Reviewed: 02/25/2015 Elsevier Interactive Patient Education  2019 [place] for Adults, Male A healthy lifestyle and preventive care can promote health and wellness. Preventive health guidelines for men include the following key practices:  A routine yearly physical is a good way to check with your health care provider about your health and preventative screening. It is a chance to share any concerns and updates on your health and to receive a thorough exam.  Visit your dentist for a routine exam and preventative care every 6 months. Brush your teeth twice a day and floss once a day. Good oral hygiene prevents tooth decay and gum disease.  The frequency of eye exams is based on your age, health, family medical history, use of contact lenses, and other factors. Follow your health care provider's  recommendations for frequency of eye exams.  Eat a healthy diet. Foods such as vegetables, fruits, whole grains, low-fat dairy products, and lean protein foods contain the nutrients you need without too many calories. Decrease your intake of foods high in solid fats, added sugars, and salt. Eat the right amount of calories for you. Get information about a proper diet from your health care provider, if necessary.  Regular physical exercise is one of the most important things you can do for your health. Most adults should get at least 150 minutes of moderate-intensity exercise (any activity that increases your heart rate and causes you to sweat) each week. In addition, most adults need muscle-strengthening exercises on 2 or more days a week.  Maintain a healthy weight. The body mass index (BMI) is a screening tool to identify possible weight problems. It provides an estimate of body fat based on height and weight. Your health care provider can find your BMI and can help you achieve or maintain a healthy weight. For adults 20 years and older:  A BMI below 18.5 is considered underweight.  A BMI of 18.5 to 24.9 is normal.  A BMI of 25 to 29.9 is considered overweight.  A BMI of 30 and above is considered  obese.  Maintain normal blood lipids and cholesterol levels by exercising and minimizing your intake of saturated fat. Eat a balanced diet with plenty of fruit and vegetables. Blood tests for lipids and cholesterol should begin at age 69 and be repeated every 5 years. If your lipid or cholesterol levels are high, you are over 50, or you are at high risk for heart disease, you may need your cholesterol levels checked more frequently. Ongoing high lipid and cholesterol levels should be treated with medicines if diet and exercise are not working.  If you smoke, find out from your health care provider how to quit. If you do not use tobacco, do not start.  Lung cancer screening is recommended for adults  aged 63-80 years who are at high risk for developing lung cancer because of a history of smoking. A yearly low-dose CT scan of the lungs is recommended for people who have at least a 30-pack-year history of smoking and are a current smoker or have quit within the past 15 years. A pack year of smoking is smoking an average of 1 pack of cigarettes a day for 1 year (for example: 1 pack a day for 30 years or 2 packs a day for 15 years). Yearly screening should continue until the smoker has stopped smoking for at least 15 years. Yearly screening should be stopped for people who develop a health problem that would prevent them from having lung cancer treatment.  If you choose to drink alcohol, do not have more than 2 drinks per day. One drink is considered to be 12 ounces (355 mL) of beer, 5 ounces (148 mL) of wine, or 1.5 ounces (44 mL) of liquor.  Avoid use of street drugs. Do not share needles with anyone. Ask for help if you need support or instructions about stopping the use of drugs.  High blood pressure causes heart disease and increases the risk of stroke. Your blood pressure should be checked at least every 1-2 years. Ongoing high blood pressure should be treated with medicines, if weight loss and exercise are not effective.  If you are 60-59 years old, ask your health care provider if you should take aspirin to prevent heart disease.  Diabetes screening is done by taking a blood sample to check your blood glucose level after you have not eaten for a certain period of time (fasting). If you are not overweight and you do not have risk factors for diabetes, you should be screened once every 3 years starting at age 72. If you are overweight or obese and you are 42-66 years of age, you should be screened for diabetes every year as part of your cardiovascular risk assessment.  Colorectal cancer can be detected and often prevented. Most routine colorectal cancer screening begins at the age of 33 and  continues through age 31. However, your health care provider may recommend screening at an earlier age if you have risk factors for colon cancer. On a yearly basis, your health care provider may provide home test kits to check for hidden blood in the stool. Use of a small camera at the end of a tube to directly examine the colon (sigmoidoscopy or colonoscopy) can detect the earliest forms of colorectal cancer. Talk to your health care provider about this at age 35, when routine screening begins. Direct exam of the colon should be repeated every 5-10 years through age 45, unless early forms of precancerous polyps or small growths are found.  People who are at  an increased risk for hepatitis B should be screened for this virus. You are considered at high risk for hepatitis B if:  You were born in a country where hepatitis B occurs often. Talk with your health care provider about which countries are considered high risk.  Your parents were born in a high-risk country and you have not received a shot to protect against hepatitis B (hepatitis B vaccine).  You have HIV or AIDS.  You use needles to inject street drugs.  You live with, or have sex with, someone who has hepatitis B.  You are a man who has sex with other men (MSM).  You get hemodialysis treatment.  You take certain medicines for conditions such as cancer, organ transplantation, and autoimmune conditions.  Hepatitis C blood testing is recommended for all people born from [birth year] through 1965 and any individual with known risks for hepatitis C.  Practice safe sex. Use condoms and avoid high-risk sexual practices to reduce the spread of sexually transmitted infections (STIs). STIs include gonorrhea, chlamydia, syphilis, trichomonas, herpes, HPV, and human immunodeficiency virus (HIV). Herpes, HIV, and HPV are viral illnesses that have no cure. They can result in disability, cancer, and death.  If you are a man who has sex with other men, you  should be screened at least once per year for:  HIV.  Urethral, rectal, and pharyngeal infection of gonorrhea, chlamydia, or both.  If you are at risk of being infected with HIV, it is recommended that you take a prescription medicine daily to prevent HIV infection. This is called preexposure prophylaxis (PrEP). You are considered at risk if:  You are a man who has sex with other men (MSM) and have other risk factors.  You are a heterosexual man, are sexually active, and are at increased risk for HIV infection.  You take drugs by injection.  You are sexually active with a partner who has HIV.  Talk with your health care provider about whether you are at high risk of being infected with HIV. If you choose to begin PrEP, you should first be tested for HIV. You should then be tested every 3 months for as long as you are taking PrEP.  A one-time screening for abdominal aortic aneurysm (AAA) and surgical repair of large AAAs by ultrasound are recommended for men ages 57 to 20 years who are current or former smokers.  Healthy men should no longer receive prostate-specific antigen (PSA) blood tests as part of routine cancer screening. Talk with your health care provider about prostate cancer screening.  Testicular cancer screening is not recommended for adult males who have no symptoms. Screening includes self-exam, a health care provider exam, and other screening tests. Consult with your health care provider about any symptoms you have or any concerns you have about testicular cancer.  Use sunscreen. Apply sunscreen liberally and repeatedly throughout the day. You should seek shade when your shadow is shorter than you. Protect yourself by wearing long sleeves, pants, a wide-brimmed hat, and sunglasses year round, whenever you are outdoors.  Once a month, do a whole-body skin exam, using a mirror to look at the skin on your back. Tell your health care provider about new moles, moles that have  irregular borders, moles that are larger than a pencil eraser, or moles that have changed in shape or color.  Stay current with required vaccines (immunizations).  Influenza vaccine. All adults should be immunized every year.  Tetanus, diphtheria, and acellular pertussis (Td, Tdap) vaccine.  An adult who has not previously received Tdap or who does not know his vaccine status should receive 1 dose of Tdap. This initial dose should be followed by tetanus and diphtheria toxoids (Td) booster doses every 10 years. Adults with an unknown or incomplete history of completing a 3-dose immunization series with Td-containing vaccines should begin or complete a primary immunization series including a Tdap dose. Adults should receive a Td booster every 10 years.  Varicella vaccine. An adult without evidence of immunity to varicella should receive 2 doses or a second dose if he has previously received 1 dose.  Human papillomavirus (HPV) vaccine. Males aged 11-21 years who have not received the vaccine previously should receive the 3-dose series. Males aged 22-26 years may be immunized. Immunization is recommended through the age of 19 years for any male who has sex with males and did not get any or all doses earlier. Immunization is recommended for any person with an immunocompromised condition through the age of 68 years if he did not get any or all doses earlier. During the 3-dose series, the second dose should be obtained 4-8 weeks after the first dose. The third dose should be obtained 24 weeks after the first dose and 16 weeks after the second dose.  Zoster vaccine. One dose is recommended for adults aged 7 years or older unless certain conditions are present.  Measles, mumps, and rubella (MMR) vaccine. Adults born before [birth year] generally are considered immune to measles and mumps. Adults born in [birth year] or later should have 1 or more doses of MMR vaccine unless there is a contraindication to the vaccine or there  is laboratory evidence of immunity to each of the three diseases. A routine second dose of MMR vaccine should be obtained at least 28 days after the first dose for students attending postsecondary schools, health care workers, or international travelers. People who received inactivated measles vaccine or an unknown type of measles vaccine during 1963-1967 should receive 2 doses of MMR vaccine. People who received inactivated mumps vaccine or an unknown type of mumps vaccine before 1979 and are at high risk for mumps infection should consider immunization with 2 doses of MMR vaccine. Unvaccinated health care workers born before [birth year] who lack laboratory evidence of measles, mumps, or rubella immunity or laboratory confirmation of disease should consider measles and mumps immunization with 2 doses of MMR vaccine or rubella immunization with 1 dose of MMR vaccine.  Pneumococcal 13-valent conjugate (PCV13) vaccine. When indicated, a person who is uncertain of his immunization history and has no record of immunization should receive the PCV13 vaccine. All adults 40 years of age and older should receive this vaccine. An adult aged 39 years or older who has certain medical conditions and has not been previously immunized should receive 1 dose of PCV13 vaccine. This PCV13 should be followed with a dose of pneumococcal polysaccharide (PPSV23) vaccine. Adults who are at high risk for pneumococcal disease should obtain the PPSV23 vaccine at least 8 weeks after the dose of PCV13 vaccine. Adults older than 54 years of age who have normal immune system function should obtain the PPSV23 vaccine dose at least 1 year after the dose of PCV13 vaccine.  Pneumococcal polysaccharide (PPSV23) vaccine. When PCV13 is also indicated, PCV13 should be obtained first. All adults aged 27 years and older should be immunized. An adult younger than age 78 years who has certain medical conditions should be immunized. Any person who resides in a  nursing home or long-term  care facility should be immunized. An adult smoker should be immunized. People with an immunocompromised condition and certain other conditions should receive both PCV13 and PPSV23 vaccines. People with human immunodeficiency virus (HIV) infection should be immunized as soon as possible after diagnosis. Immunization during chemotherapy or radiation therapy should be avoided. Routine use of PPSV23 vaccine is not recommended for American Indians, [place] Natives, or people younger than 65 years unless there are medical conditions that require PPSV23 vaccine. When indicated, people who have unknown immunization and have no record of immunization should receive PPSV23 vaccine. One-time revaccination 5 years after the first dose of PPSV23 is recommended for people aged 19-64 years who have chronic kidney failure, nephrotic syndrome, asplenia, or immunocompromised conditions. People who received 1-2 doses of PPSV23 before age 25 years should receive another dose of PPSV23 vaccine at age 78 years or later if at least 5 years have passed since the previous dose. Doses of PPSV23 are not needed for people immunized with PPSV23 at or after age 51 years.  Meningococcal vaccine. Adults with asplenia or persistent complement component deficiencies should receive 2 doses of quadrivalent meningococcal conjugate (MenACWY-D) vaccine. The doses should be obtained at least 2 months apart. Microbiologists working with certain meningococcal bacteria, [place] recruits, people at risk during an outbreak, and people who travel to or live in countries with a high rate of meningitis should be immunized. A first-year college student up through age 92 years who is living in a residence hall should receive a dose if he did not receive a dose on or after his 16th birthday. Adults who have certain high-risk conditions should receive one or more doses of vaccine.  Hepatitis A vaccine. Adults who wish to be protected  from this disease, have chronic liver disease, work with hepatitis A-infected animals, work in hepatitis A research labs, or travel to or work in countries with a high rate of hepatitis A should be immunized. Adults who were previously unvaccinated and who anticipate close contact with an international adoptee during the first 60 days after arrival in the [place] from a country with a high rate of hepatitis A should be immunized.  Hepatitis B vaccine. Adults should be immunized if they wish to be protected from this disease, are under age 40 years and have diabetes, have chronic liver disease, have had more than one sex partner in the past 6 months, may be exposed to blood or other infectious body fluids, are household contacts or sex partners of hepatitis B positive people, are clients or workers in certain care facilities, or travel to or work in countries with a high rate of hepatitis B.  Haemophilus influenzae type b (Hib) vaccine. A previously unvaccinated person with asplenia or sickle cell disease or having a scheduled splenectomy should receive 1 dose of Hib vaccine. Regardless of previous immunization, a recipient of a hematopoietic stem cell transplant should receive a 3-dose series 6-12 months after his successful transplant. Hib vaccine is not recommended for adults with HIV infection. Preventive Service / Frequency Ages 55 to 29  Blood pressure check.** / Every 3-5 years.  Lipid and cholesterol check.** / Every 5 years beginning at age 59.  Hepatitis C blood test.** / For any individual with known risks for hepatitis C.  Skin self-exam. / Monthly.  Influenza vaccine. / Every year.  Tetanus, diphtheria, and acellular pertussis (Tdap, Td) vaccine.** / Consult your health care provider. 1 dose of Td every 10 years.  Varicella vaccine.** / Consult your  health care provider.  HPV vaccine. / 3 doses over 6 months, if 71 or younger.  Measles, mumps, rubella (MMR) vaccine.** / You  need at least 1 dose of MMR if you were born in 1957 or later. You may also need a second dose.  Pneumococcal 13-valent conjugate (PCV13) vaccine.** / Consult your health care provider.  Pneumococcal polysaccharide (PPSV23) vaccine.** / 1 to 2 doses if you smoke cigarettes or if you have certain conditions.  Meningococcal vaccine.** / 1 dose if you are age 89 to 81 years and a Market researcher living in a residence hall, or have one of several medical conditions. You may also need additional booster doses.  Hepatitis A vaccine.** / Consult your health care provider.  Hepatitis B vaccine.** / Consult your health care provider.  Haemophilus influenzae type b (Hib) vaccine.** / Consult your health care provider. Ages 73 to 34  Blood pressure check.** / Every year.  Lipid and cholesterol check.** / Every 5 years beginning at age 80.  Lung cancer screening. / Every year if you are aged 3-80 years and have a 30-pack-year history of smoking and currently smoke or have quit within the past 15 years. Yearly screening is stopped once you have quit smoking for at least 15 years or develop a health problem that would prevent you from having lung cancer treatment.  Fecal occult blood test (FOBT) of stool. / Every year beginning at age 69 and continuing until age 9. You may not have to do this test if you get a colonoscopy every 10 years.  Flexible sigmoidoscopy** or colonoscopy.** / Every 5 years for a flexible sigmoidoscopy or every 10 years for a colonoscopy beginning at age 16 and continuing until age 76.  Hepatitis C blood test.** / For all people born from [birth year] through 1965 and any individual with known risks for hepatitis C.  Skin self-exam. / Monthly.  Influenza vaccine. / Every year.  Tetanus, diphtheria, and acellular pertussis (Tdap/Td) vaccine.** / Consult your health care provider. 1 dose of Td every 10 years.  Varicella vaccine.** / Consult your health care  provider.  Zoster vaccine.** / 1 dose for adults aged 73 years or older.  Measles, mumps, rubella (MMR) vaccine.** / You need at least 1 dose of MMR if you were born in 1957 or later. You may also need a second dose.  Pneumococcal 13-valent conjugate (PCV13) vaccine.** / Consult your health care provider.  Pneumococcal polysaccharide (PPSV23) vaccine.** / 1 to 2 doses if you smoke cigarettes or if you have certain conditions.  Meningococcal vaccine.** / Consult your health care provider.  Hepatitis A vaccine.** / Consult your health care provider.  Hepatitis B vaccine.** / Consult your health care provider.  Haemophilus influenzae type b (Hib) vaccine.** / Consult your health care provider. Ages 54 and over  Blood pressure check.** / Every year.  Lipid and cholesterol check.**/ Every 5 years beginning at age 51.  Lung cancer screening. / Every year if you are aged 40-80 years and have a 30-pack-year history of smoking and currently smoke or have quit within the past 15 years. Yearly screening is stopped once you have quit smoking for at least 15 years or develop a health problem that would prevent you from having lung cancer treatment.  Fecal occult blood test (FOBT) of stool. / Every year beginning at age 58 and continuing until age 28. You may not have to do this test if you get a colonoscopy every 10 years.  Flexible  sigmoidoscopy** or colonoscopy.** / Every 5 years for a flexible sigmoidoscopy or every 10 years for a colonoscopy beginning at age 30 and continuing until age 78.  Hepatitis C blood test.** / For all people born from [birth year] through 1965 and any individual with known risks for hepatitis C.  Abdominal aortic aneurysm (AAA) screening.** / A one-time screening for ages 25 to 75 years who are current or former smokers.  Skin self-exam. / Monthly.  Influenza vaccine. / Every year.  Tetanus, diphtheria, and acellular pertussis (Tdap/Td) vaccine.** / 1 dose of Td every 10  years.  Varicella vaccine.** / Consult your health care provider.  Zoster vaccine.** / 1 dose for adults aged 41 years or older.  Pneumococcal 13-valent conjugate (PCV13) vaccine.** / 1 dose for all adults aged 33 years and older.  Pneumococcal polysaccharide (PPSV23) vaccine.** / 1 dose for all adults aged 26 years and older.  Meningococcal vaccine.** / Consult your health care provider.  Hepatitis A vaccine.** / Consult your health care provider.  Hepatitis B vaccine.** / Consult your health care provider.  Haemophilus influenzae type b (Hib) vaccine.** / Consult your health care provider. **Family history and personal history of risk and conditions may change your health care provider's recommendations.   This information is not intended to replace advice given to you by your health care provider. Make sure you discuss any questions you have with your health care provider.   Document Released: 06/22/2001 Document Revised: 05/17/2014 Document Reviewed: 09/21/2010 Elsevier Interactive Patient Education [employer].

## 2019-08-07 NOTE — Addendum Note (Signed)
Addended by: Mickel Crow on: 08/07/2019 12:19 PM   Modules accepted: Orders

## 2019-08-07 NOTE — Progress Notes (Signed)
Male physical  Impression and Recommendations:    1. Encounter for wellness examination   2. Health education/counseling   3. Acquired hypothyroidism   4. Vision blurred   5. SCC (squamous cell carcinoma), arm, right   6. S/P partial colectomy- after perf from diverticulitis    7. Family history of prostate cancer in father     Of note, this is my first time meeting patient.  Patient is new to me and was previously being cared for at our office by Mina Marble, NP, who no longer works at primary care [employer].   1) Anticipatory Guidance: Discussed skin CA prevention and sunscreen when outside along with skin surveillance; eating a balanced and modest diet; physical activity at least 25 minutes per day or minimum of 150 min/ week moderate to intense activity.  - Encouraged patient to avoid use of Q-tips in the ear, and instead utilize a mixture of half hydrogen peroxide, half rubbing alcohol in a 1:1 solution.  - Given patient's personal history of squamous cell carcinoma, advised patient to follow up with dermatology for regular surveillance.  He will call for appt- been two yrs since last OV  - Advised patient to engage in prudent self-testicular screening habits.   2) Immunizations / Screenings / Labs:   All immunizations are up-to-date per recommendations or will be updated today if pt allows.    - Patient understands with dental and vision screens they will schedule independently.  - Will obtain CBC, CMP, HgA1c, Lipid panel, TSH and vit D when fasting, if not already done past 12 mo/ recently   - Given history of prostate cancer in father, after long discussion about +/- with pt, PSA added to lab work today.  - Last colonoscopy obtained 06/03/2014; repeat advised in 5 years. - Due for colonoscopy.  His mother died this past 06-03-2022 and he had to put it off.  Patient knows he will just call and schedule this in the near future at his convenience.  - TDAP up  to date, last obtained in 2015.  - Need for shingles vaccination.   - Education provided to patient today regarding Shingrix vaccine. - Patient agrees to obtain vaccination.  However, discussed he should schedule and obtain covid now and call for shingles 54 after second Covid.   - Per patient, has not obtained COVID-19 vaccine yet. - Advised patient to wait 45 days between the COVID-19 vaccination and any other vaccine.  - Discussed indications for Hep C/HIV screen. - Patient is very low risk and declines at this time.   3) Health Counseling, Preventative Maintenance, and Weight - BMI is 37.94 kg/m. BMI meaning discussed with patient.  Discussed goal to improve diet habits to improve overall feelings of well being and objective health data. Improve nutrient density of diet through increasing intake of fruits and vegetables and decreasing saturated fats, white flour products and refined sugars.   - Advised patient to continue working toward exercising and prudent weight loss to improve overall mental, physical, and emotional health.    - Reviewed the "spokes of the wheel" of wellbeing.  Stressed the importance of ongoing prudent habits, including regular exercise, appropriate sleep hygiene, healthful dietary habits, and prayer/meditation to relax.  - Encouraged patient to engage in daily cardiovascular activity as tolerated, especially a formal exercise routine.  Recommended that the patient eventually strive for at least 150 minutes of moderate cardiovascular activity per week according to guidelines established  by the [facility].   - Encouraged patient to begin with 10-15 minutes of cardio per day. Work up to 30-54min  - Healthy dietary habits encouraged, including low-carb, and high amounts of lean protein in diet.   - Patient should also consume adequate amounts of water.  - Health counseling performed.  All questions answered.   Orders Placed This Encounter  Procedures  . Ambulatory  referral to Ophthalmology    Referral Priority:   Routine    Referral Type:   Consultation    Referral Reason:   Specialty Services Required    Requested Specialty:   Ophthalmology    Number of Visits Requested:   1    Meds ordered this encounter  Medications  . levothyroxine (SYNTHROID) 75 MCG tablet    Sig: Take 1 tablet (75 mcg total) by mouth daily before breakfast.    Dispense:  90 tablet    Refill:  0    Return for 4-54mo f/up or sooner if issues.   Reminded pt important of f-up preventative CPE in 1 year.  Reminded pt again, this is in addition to any chronic care visits.    Gross side effects, risk and benefits, and alternatives of medications discussed with patient.  Patient is aware that all medications have potential side effects and we are unable to predict every side effect or drug-drug interaction that may occur.  Expresses verbal understanding and consents to current therapy plan and treatment regimen.  Please see AVS handed out to patient at the end of our visit for further patient instructions/ counseling done pertaining to today's office visit.   This case required medical decision making of at least moderate complexity.  This document serves as a record of services personally performed by Mellody Dance, DO. It was created on her behalf by [name], a trained medical scribe. The creation of this record is based on the scribe's personal observations and the provider's statements to them.   The above documentation from [name], medical scribe, has been reviewed by Marjory Sneddon, D.O.      Subjective:     I, [name], am serving as Education administrator for [employer].   CC: CPE   HPI: BIFF CHARON is a 54 y.o. male who presents to [place] [facility] today for a yearly health maintenance exam.     Health Maintenance Summary  - Reviewed and updated, unless pt declines services.  Last Cologuard or  Colonoscopy:  Patient with personal history of colonic polyps.  Last colonoscopy obtained January of 2016; repeat advised in 5 years Family History of Colon Cancer:  His uncle had colon cancer.  Notes in the past, he had a perforation due to diverticulitis, and had a partial colectomy.  Notes that he has had two colonoscopies since then.  Tobacco History Reviewed:  Never smoker.  Alcohol / drug use:    No concerns, no excessive use / no use Exercise Habits:  Not meeting AHA guidelines. Dental Home:  yes Eye exams:   He does not currently have an eye doctor.  Denies family history of diabetes, macular degeneration, glaucoma. Dermatology home:   Patient reports a personal history of squamous cell carcinoma on his right arm, about 2.5 years ago.  He has not followed up with dermatology yearly since, but notes he has followed up with them several times in the past.  Denies new skin changes.  Male history: STD concerns:  None, monogamous with wife. Birth control  method:  Wife has tubal ligation. Additional penile/ urinary concerns:  Denies concerns with urination.  Denies frequency or nocturia.  Dad had prostate cancer.  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  Denies GI concerns.  Additional concerns beyond Health Maintenance issues:       Immunization History  Administered Date(s) Administered  . Influenza,inj,Quad PF,6+ Mos 06/10/2016, 03/22/2017, 02/28/2018  . Influenza,inj,quad, With Preservative 02/21/2014  . Tdap 08/06/2013  . Zoster Recombinat (Shingrix) 05/08/2018     Health Maintenance  Topic Date Due  . INFLUENZA VACCINE  12/09/2018  . TETANUS/TDAP  08/07/2023  . COLONOSCOPY  06/07/2024  . HIV Screening  Completed       Wt Readings from Last 3 Encounters:  08/07/19 256 lb 14.4 oz (116.5 kg)  07/09/19 248 lb (112.5 kg)  04/16/19 258 lb (117 kg)   BP Readings from Last 3 Encounters:  08/07/19 117/79  07/09/19 132/67  04/16/19 (!) 148/72   Pulse  Readings from Last 3 Encounters:  08/07/19 90  07/09/19 83  04/16/19 85    Patient Active Problem List   Diagnosis Date Noted  . Morbid obesity ([place]) 03/03/2014  . Hyperlipidemia 02/04/2013  . Prediabetes 07/09/2019  . Acquired hypothyroidism 06/10/2016  . Gastroesophageal reflux disease without esophagitis 03/03/2014  . Anxiety 02/04/2013  . SCC (squamous cell carcinoma), arm, right 08/07/2019  . S/P partial colectomy- after perf from diverticulitis  08/07/2019  . Low HDL (under 40) 07/09/2019  . Exercise induced bronchospasm 08/04/2018  . Perennial allergic rhinitis 08/04/2018  . Family history of prostate cancer in father 05/08/2018  . Cough variant asthma 09/22/2017  . Cough in adult 09/07/2017  . Wheezing 09/07/2017  . Screening for colon cancer 03/22/2017  . Healthcare maintenance 03/22/2017  . Atypical mole 12/14/2016  . BPPV (benign paroxysmal positional vertigo) 12/14/2016  . Other fatigue 06/10/2016  . Family history of diabetes mellitus 06/10/2016  . Need for influenza vaccination 06/10/2016  . Need for prophylactic vaccination with combined diphtheria-tetanus-pertussis (DTP) vaccine 08/06/2013  . Diverticulitis   . Sleep apnea   . History of colon polyps     Past Medical History:  Diagnosis Date  . Anxiety   . Asthma   . Diverticulitis   . GERD (gastroesophageal reflux disease)   . History of colon polyps   . Hypercholesteremia   . Sleep apnea   . Thyroid disease     Past Surgical History:  Procedure Laterality Date  . COLECTOMY  2208   12 inches - Dr. [facility]  . KNEE SURGERY Bilateral     Family History  Problem Relation Age of Onset  . Hypertension Father   . Heart disease Father        He died at 38 from MI   . Stroke Mother   . Dementia Mother   . Breast cancer Mother   . Cancer Brother        thyroid  . Diabetes Brother   . Hypertension Brother   . Asthma Son   . Allergic rhinitis Son   . Asthma Daughter   . Allergic  rhinitis Daughter   . Colon cancer Paternal Uncle        early 39s    Social History   Substance and Sexual Activity  Drug Use No  ,  Social History   Substance and Sexual Activity  Alcohol Use No  ,  Social History   Tobacco Use  Smoking Status Never Smoker  Smokeless Tobacco Current  User  . Types: Snuff  ,  Social History   Substance and Sexual Activity  Sexual Activity Not on file    Patient's Medications  New Prescriptions   No medications on file  Previous Medications   ALBUTEROL (PROVENTIL HFA;VENTOLIN HFA) 108 (90 BASE) MCG/ACT INHALER    Inhale 2 puffs into the lungs every 6 (six) hours as needed for wheezing or shortness of breath.   CHOLECALCIFEROL (VITAMIN D3) 2000 UNITS TABS    Take 1 tablet by mouth daily.   FLUTICASONE PROPIONATE (XHANCE) 93 MCG/ACT EXHU    Place 2 puffs into the nose 2 (two) times daily.   ROSUVASTATIN (CRESTOR) 40 MG TABLET    Take 1 tablet (40 mg total) by mouth at bedtime.   SERTRALINE (ZOLOFT) 50 MG TABLET    Take 0.5 tablets (25 mg total) by mouth daily.  Modified Medications   Modified Medication Previous Medication   LEVOTHYROXINE (SYNTHROID) 75 MCG TABLET SYNTHROID 75 MCG tablet      Take 1 tablet (75 mcg total) by mouth daily before breakfast.    TAKE 1 TABLET DAILY BEFORE BREAKFAST  Discontinued Medications   No medications on file    Patient has no known allergies.  Review of Systems: General:   Denies fever, chills, unexplained weight loss.  Optho/Auditory:   Denies visual changes, blurred vision/LOV Respiratory:   Denies SOB, DOE more than baseline levels.   Cardiovascular:   Denies chest pain, palpitations, new onset peripheral edema  Gastrointestinal:   Denies nausea, vomiting, diarrhea.  Genitourinary: Denies dysuria, freq/ urgency, flank pain or discharge from genitals.  Endocrine:     Denies hot or cold intolerance, polyuria, polydipsia. Musculoskeletal:   Denies unexplained myalgias, joint swelling, unexplained  arthralgias, gait problems.  Skin:  Denies rash, suspicious lesions Neurological:     Denies dizziness, unexplained weakness, numbness  Psychiatric/Behavioral:   Denies mood changes, suicidal or homicidal ideations, hallucinations    Objective:     Blood pressure 117/79, pulse 90, temperature 98 F (36.7 C), temperature source Oral, resp. rate 12, height 5\' 9"  (1.753 m), weight 256 lb 14.4 oz (116.5 kg), SpO2 97 %. Body mass index is 37.94 kg/m. General Appearance:    Alert, cooperative, no distress, appears stated age  Head:    Normocephalic, without obvious abnormality, atraumatic  Eyes:    PERRL, conjunctiva/corneas clear, EOM's intact, fundi    benign, both eyes  Ears:    Normal TM's and external ear canals, both ears  Nose:   Nares normal, septum midline, mucosa normal, no drainage    or sinus tenderness  Throat:   Lips w/o lesion, mucosa moist, and tongue normal; teeth and   gums normal  Neck:   Supple, symmetrical, trachea midline, no adenopathy;    thyroid:  no enlargement/tenderness/nodules; no carotid   bruit or JVD  Back:     Symmetric, no curvature, ROM normal, no CVA tenderness  Lungs:     Clear to auscultation bilaterally, respirations unlabored, no       Wh/ R/ R  Chest Wall:    No tenderness or gross deformity; normal excursion   Heart:    Regular rate and rhythm, S1 and S2 normal, no murmur, rub   or gallop  Abdomen:     Obese abdomen.  Soft, non-tender, bowel sounds active all four quadrants, NO   G/R/R, no masses, no organomegaly  Genitalia:    Ext genitalia: without lesion, no penile rash or discharge, no hernias appreciated  Rectal:    Normal tone, prostate WNL's and equal b/l, no tenderness; guaiac negative stool  Extremities:   Extremities normal, atraumatic, no cyanosis or gross edema  Pulses:   2+ and symmetric all extremities  Skin:   Warm, dry, Skin color, texture, turgor normal, no obvious rashes or lesions  M-Sk:   Ambulates * 4 w/o difficulty, no  gross deformities, tone WNL  Neurologic:   CNII-XII intact, normal strength, sensation and reflexes    Throughout Psych:  No HI/SI, judgement and insight good, Euthymic mood. Full Affect.

## 2019-08-08 ENCOUNTER — Encounter: Payer: Self-pay | Admitting: Gastroenterology

## 2019-08-08 LAB — CBC WITH DIFFERENTIAL/PLATELET
Basophils Absolute: 0.1 10*3/uL (ref 0.0–0.2)
Basos: 1 %
EOS (ABSOLUTE): 0.3 10*3/uL (ref 0.0–0.4)
Eos: 5 %
Hematocrit: 44.4 % (ref 37.5–51.0)
Hemoglobin: 14.8 g/dL (ref 13.0–17.7)
Immature Grans (Abs): 0.1 10*3/uL (ref 0.0–0.1)
Immature Granulocytes: 1 %
Lymphocytes Absolute: 1.7 10*3/uL (ref 0.7–3.1)
Lymphs: 27 %
MCH: 29.3 pg (ref 26.6–33.0)
MCHC: 33.3 g/dL (ref 31.5–35.7)
MCV: 88 fL (ref 79–97)
Monocytes Absolute: 0.6 10*3/uL (ref 0.1–0.9)
Monocytes: 9 %
Neutrophils Absolute: 3.6 10*3/uL (ref 1.4–7.0)
Neutrophils: 57 %
Platelets: 151 10*3/uL (ref 150–450)
RBC: 5.05 x10E6/uL (ref 4.14–5.80)
RDW: 12.2 % (ref 11.6–15.4)
WBC: 6.2 10*3/uL (ref 3.4–10.8)

## 2019-08-08 LAB — COMPREHENSIVE METABOLIC PANEL WITH GFR
ALT: 24 [IU]/L (ref 0–44)
AST: 22 [IU]/L (ref 0–40)
Albumin/Globulin Ratio: 1.6 (ref 1.2–2.2)
Albumin: 4.2 g/dL (ref 3.8–4.9)
Alkaline Phosphatase: 66 [IU]/L (ref 39–117)
BUN/Creatinine Ratio: 13 (ref 9–20)
BUN: 12 mg/dL (ref 6–24)
Bilirubin Total: 0.4 mg/dL (ref 0.0–1.2)
CO2: 23 mmol/L (ref 20–29)
Calcium: 9.3 mg/dL (ref 8.7–10.2)
Chloride: 103 mmol/L (ref 96–106)
Creatinine, Ser: 0.94 mg/dL (ref 0.76–1.27)
GFR calc Af Amer: 106 mL/min/{1.73_m2}
GFR calc non Af Amer: 92 mL/min/{1.73_m2}
Globulin, Total: 2.6 g/dL (ref 1.5–4.5)
Glucose: 111 mg/dL — ABNORMAL HIGH (ref 65–99)
Potassium: 4.6 mmol/L (ref 3.5–5.2)
Sodium: 138 mmol/L (ref 134–144)
Total Protein: 6.8 g/dL (ref 6.0–8.5)

## 2019-08-08 LAB — HEMOGLOBIN A1C
Est. average glucose Bld gHb Est-mCnc: 134 mg/dL
Hgb A1c MFr Bld: 6.3 % — ABNORMAL HIGH (ref 4.8–5.6)

## 2019-08-08 LAB — LIPID PANEL
Chol/HDL Ratio: 3.4 ratio (ref 0.0–5.0)
Cholesterol, Total: 121 mg/dL (ref 100–199)
HDL: 36 mg/dL — ABNORMAL LOW
LDL Chol Calc (NIH): 68 mg/dL (ref 0–99)
Triglycerides: 88 mg/dL (ref 0–149)
VLDL Cholesterol Cal: 17 mg/dL (ref 5–40)

## 2019-08-08 LAB — T4, FREE: Free T4: 1.12 ng/dL (ref 0.82–1.77)

## 2019-08-08 LAB — VITAMIN D 25 HYDROXY (VIT D DEFICIENCY, FRACTURES): Vit D, 25-Hydroxy: 34.8 ng/mL (ref 30.0–100.0)

## 2019-08-08 LAB — TSH: TSH: 3.42 u[IU]/mL (ref 0.450–4.500)

## 2019-08-11 ENCOUNTER — Other Ambulatory Visit: Payer: Self-pay | Admitting: Family Medicine

## 2019-08-11 DIAGNOSIS — E559 Vitamin D deficiency, unspecified: Secondary | ICD-10-CM

## 2019-08-11 DIAGNOSIS — Z0000 Encounter for general adult medical examination without abnormal findings: Secondary | ICD-10-CM

## 2019-08-11 MED ORDER — VITAMIN D3 50 MCG (2000 UT) PO TABS: 2.0000 | ORAL_TABLET | Freq: Every day | ORAL | Status: DC

## 2019-08-21 ENCOUNTER — Encounter: Payer: Self-pay | Admitting: Allergy

## 2019-08-21 ENCOUNTER — Other Ambulatory Visit: Payer: Self-pay

## 2019-08-21 ENCOUNTER — Ambulatory Visit (INDEPENDENT_AMBULATORY_CARE_PROVIDER_SITE_OTHER): Payer: 59 | Admitting: Allergy

## 2019-08-21 VITALS — BP 110/82 | HR 80 | Temp 98.6°F | Resp 10 | Ht 69.0 in | Wt 257.0 lb

## 2019-08-21 DIAGNOSIS — J3089 Other allergic rhinitis: Secondary | ICD-10-CM

## 2019-08-21 DIAGNOSIS — J4520 Mild intermittent asthma, uncomplicated: Secondary | ICD-10-CM

## 2019-08-21 NOTE — Progress Notes (Signed)
[address] [place] [address] Dept: [phone]  FOLLOW UP NOTE  Patient ID: Elijah Small, male    DOB: December 09, 1965  Age: 54 y.o. MRN: [id number] Date of Office Visit: 08/21/2019  Assessment  Chief Complaint: Cough  HPI Elijah Small presents today for follow up of allergic rhinitis and cough.  He was last seen on August 03, 2018 by Dr. Ernst Bowler.  Allergic rhinitis is reported as well controlled with the use of XHANCE 2 sprays per nostril 2 times a day.  He reports that this helps with congestion, pressure, and headaches.  He denies any runny/stuffy nose or sneezing.  Cough is reported as not well controlled with the use of albuterol as needed.  He reports a sensation of phlegm feeling in his throat and reports that when he was on Symbicort in the past this helped with the cough and his symptoms almost went away.  He did report that the Symbicort did cause hoarseness and a dry sensation.  He was not using a spacer with the Symbicort at the time.  He reports that he stopped the Symbicort due to cost.  His cough occurs throughout the day.  He is using his albuterol inhaler approximately 1-2 times a month. He denies any symptoms of heartburn or reflux   Drug Allergies:  No Known Allergies    Physical Exam: BP 110/82   Pulse 80   Temp 98.6 F (37 C) (Temporal)   Resp 10   Ht 5\' 9"  (1.753 m)   Wt 257 lb (116.6 kg)   SpO2 94%   BMI 37.95 kg/m    Physical Exam Vitals and nursing note reviewed.  Constitutional:      Appearance: Normal appearance.  HENT:     Head: Normocephalic and atraumatic.     Comments: Pharynx normal. Eyes normal. Ears normal. Nose normal    Right Ear: Tympanic membrane, ear canal and external ear normal.     Left Ear: Tympanic membrane, ear canal and external ear normal.     Nose: Nose normal.     Mouth/Throat:     Mouth: Mucous membranes are moist.  Eyes:     Conjunctiva/sclera: Conjunctivae normal.  Cardiovascular:     Rate and  Rhythm: Normal rate and regular rhythm.     Heart sounds: Normal heart sounds.  Pulmonary:     Effort: Pulmonary effort is normal.     Comments: Expiratory wheezing heard throughout all lung fields. Skin:    General: Skin is warm.  Neurological:     General: No focal deficit present.     Mental Status: He is alert and oriented to person, place, and time.  Psychiatric:        Mood and Affect: Mood normal.        Behavior: Behavior normal.        Thought Content: Thought content normal.        Judgment: Judgment normal.     Diagnostics: FVC 3.92 L, FEV1 3.17 L, Predicted FVC 4.77 L, FEV1 3.67. Spirometry indicates: normal ventilatory function. Coughing noted through out the study.     Assessment and Plan: 1. Mild intermittent asthma, unspecified whether complicated   2. Perennial allergic rhinitis    Allergic rhinits Continue XHANCE 2 sprays each nostril twice a day to help with nasal congestion and pressure. Continue avoidance measures for dust mites and and pets  Mild intermittent asthma Start Qvar 40 mcg-take 2 puffs twice a day. If coughing or phlegm after  4 weeks may increase to 3 puffs twice a day.  Rinse mouth out afterwards. 2 samples of Qvar 40 mcg given along with demonstration. May use albuterol 2 puffs every 4-6 hours as needed for wheezing, tightness in chest, or shortness of breath.  Also may use albuterol 5 to 15 minutes prior to exercise or strenuous activity. Asthma control goals:   Full participation in all desired activities (may need albuterol before activity)  Albuterol use two time or less a week on average (not counting use with activity)  Cough interfering with sleep two time or less a month  Oral steroids no more than once a year  No hospitalizations   Continue all other medications. Please let Elijah Small know if this treatment plan is not working for you.  Schedule follow-up in 2 months Thank you for the opportunity to care for this patient.  Please  do not hesitate to contact me with questions.  Althea Charon, FNP [facility]  Addendum:  I performed/discussed the history and physical examination of the patient as well as management with NP Ambs. I reviewed the NP's note and agree with the documented findings and plan of care with following additions/exceptions: none  Prudy Feeler, MD [facility]

## 2019-08-21 NOTE — Patient Instructions (Addendum)
Allergic rhinits Continue XHANCE 2 sprays each nostril twice a day to help with nasal congestion and pressure. Continue avoidance measures for dust mites and and pets  Mild intermittent asthma Start Qvar 40 mcg-take 2 puffs twice a day. If coughing or phlegm after 4 weeks may increase to 3 puffs twice a day.  Rinse mouth out afterwards. 2 samples of Qvar 40 mcg given along with demonstration. May use albuterol 2 puffs every 4-6 hours as needed for wheezing, tightness in chest, or shortness of breath.  Also may use albuterol 5 to 15 minutes prior to exercise or strenuous activity. Asthma control goals:   Full participation in all desired activities (may need albuterol before activity)  Albuterol use two time or less a week on average (not counting use with activity)  Cough interfering with sleep two time or less a month  Oral steroids no more than once a year  No hospitalizations   Continue all other medications. Please let us know if this treatment plan is not working for you.  Schedule follow-up in 2 months

## 2019-08-21 NOTE — Progress Notes (Signed)
[address] [place] [address] Dept: [phone]  FOLLOW UP NOTE  Patient ID: Elijah Small, male    DOB: 1965-06-13  Age: 54 y.o. MRN: [id number] Date of Office Visit: 08/21/2019  Assessment  Chief Complaint: Cough  HPI Elijah Small presents today for follow up of allergic rhinitis and cough.  He was last seen on August 03, 2018 by Dr. Ernst Bowler.  Allergic rhinitis is reported as well controlled with the use of XHANCE 2 sprays per nostril 2 times a day.  He reports that this helps with congestion, pressure, and headaches.  He denies any runny/stuffy nose or sneezing.  Cough is reported as not well controlled with the use of albuterol as needed.  He reports a sensation of phlegm feeling in his throat and reports that when he was on Symbicort in the past this helped with the cough and his symptoms almost went away.  He did report that the Symbicort did cause hoarseness and a dry sensation.  He was not using a spacer with the Symbicort at the time.  He reports that he stopped the Symbicort due to cost.  His cough occurs throughout the day.  He is using his albuterol inhaler approximately 1-2 times a month. He denies any symptoms of heartburn or reflux   Drug Allergies:  No Known Allergies    Physical Exam: BP 110/82   Pulse 80   Temp 98.6 F (37 C) (Temporal)   Resp 10   Ht 5\' 9"  (1.753 m)   Wt 257 lb (116.6 kg)   SpO2 94%   BMI 37.95 kg/m    Physical Exam Vitals and nursing note reviewed.  Constitutional:      Appearance: Normal appearance.  HENT:     Head: Normocephalic and atraumatic.     Comments: Pharynx normal. Eyes normal. Ears normal. Nose normal    Right Ear: Tympanic membrane, ear canal and external ear normal.     Left Ear: Tympanic membrane, ear canal and external ear normal.     Nose: Nose normal.     Mouth/Throat:     Mouth: Mucous membranes are moist.  Eyes:     Conjunctiva/sclera: Conjunctivae normal.  Cardiovascular:     Rate and  Rhythm: Normal rate and regular rhythm.     Heart sounds: Normal heart sounds.  Pulmonary:     Effort: Pulmonary effort is normal.     Comments: Expiratory wheezing heard throughout all lung fields. Skin:    General: Skin is warm.  Neurological:     General: No focal deficit present.     Mental Status: He is alert and oriented to person, place, and time.  Psychiatric:        Mood and Affect: Mood normal.        Behavior: Behavior normal.        Thought Content: Thought content normal.        Judgment: Judgment normal.     Diagnostics: FVC 3.92 L, FEV1 3.17 L, Predicted FVC 4.77 L, FEV1 3.67. Spirometry indicates: normal ventilatory function. Coughing noted through out the study.     Assessment and Plan: 1. Mild intermittent asthma, unspecified whether complicated   2. Perennial allergic rhinitis    Allergic rhinits Continue XHANCE 2 sprays each nostril twice a day to help with nasal congestion and pressure. Continue avoidance measures for dust mites and and pets  Mild intermittent asthma Start Qvar 40 mcg-take 2 puffs twice a day. If coughing or phlegm after  4 weeks may increase to 3 puffs twice a day.  Rinse mouth out afterwards. 2 samples of Qvar 40 mcg given along with demonstration. May use albuterol 2 puffs every 4-6 hours as needed for wheezing, tightness in chest, or shortness of breath.  Also may use albuterol 5 to 15 minutes prior to exercise or strenuous activity. Asthma control goals:   Full participation in all desired activities (may need albuterol before activity)  Albuterol use two time or less a week on average (not counting use with activity)  Cough interfering with sleep two time or less a month  Oral steroids no more than once a year  No hospitalizations   Continue all other medications. Please let us know if this treatment plan is not working for you.  Schedule follow-up in 2 months Thank you for the opportunity to care for this patient.  Please  do not hesitate to contact me with questions.  Althea Charon, FNP [facility]  Addendum:  I performed/discussed the history and physical examination of the patient as well as management with NP Ambs. I reviewed the NP's note and agree with the documented findings and plan of care with following additions/exceptions: none  Prudy Feeler, MD [facility]

## 2019-08-24 ENCOUNTER — Other Ambulatory Visit: Payer: Self-pay | Admitting: Allergy & Immunology

## 2019-08-24 ENCOUNTER — Encounter: Payer: Self-pay | Admitting: Family Medicine

## 2019-09-05 ENCOUNTER — Other Ambulatory Visit: Payer: Self-pay

## 2019-09-05 ENCOUNTER — Ambulatory Visit (AMBULATORY_SURGERY_CENTER): Payer: 59

## 2019-09-05 VITALS — Ht 69.0 in

## 2019-09-05 DIAGNOSIS — Z800 Family history of malignant neoplasm of digestive organs: Secondary | ICD-10-CM

## 2019-09-05 DIAGNOSIS — Z86010 Personal history of colonic polyps: Secondary | ICD-10-CM

## 2019-09-05 NOTE — Progress Notes (Signed)
Patient's pre-visit was done today over the phone with the patient due to COVID-19 pandemic.   Name,DOB and address verified. Insurance verified.   Packet of Prep instructions mailed to patient including copy of a consent form and pre-procedure patient acknowledgement form-pt is aware.   [employer] Coupon included. Patient understands to call us back with any questions or concerns.   COVID-19 Series has been completed.  Pt is aware that care partner will wait in the car during procedure; if they feel like they will be too hot or cold to wait in the car; they may wait in the 4 th floor lobby. Patient is aware to bring only one care partner. We want them to wear a mask (we do not have any that we can provide them), practice social distancing, and we will check their temperatures when they get here.  I did remind the patient that their care partner needs to stay in the parking lot the entire time and have a cell phone available, we will call them when the pt is ready for discharge. Patient will wear mask into building.

## 2019-09-06 MED ORDER — SUTAB 1479-225-188 MG PO TABS: 1.0000 | ORAL_TABLET | ORAL | 0 refills | Status: DC

## 2019-09-06 NOTE — Addendum Note (Signed)
Addended by: Levonne Spiller on: 09/06/2019 05:00 PM   Modules accepted: Orders

## 2019-09-14 ENCOUNTER — Encounter: Payer: Self-pay | Admitting: Gastroenterology

## 2019-09-19 ENCOUNTER — Other Ambulatory Visit: Payer: Self-pay

## 2019-09-19 ENCOUNTER — Encounter: Payer: Self-pay | Admitting: Gastroenterology

## 2019-09-19 ENCOUNTER — Ambulatory Visit (AMBULATORY_SURGERY_CENTER): Payer: 59 | Admitting: Gastroenterology

## 2019-09-19 VITALS — BP 120/77 | HR 58 | Temp 97.5°F | Resp 15 | Ht 69.0 in | Wt 245.0 lb

## 2019-09-19 DIAGNOSIS — Z86010 Personal history of colonic polyps: Secondary | ICD-10-CM | POA: Diagnosis not present

## 2019-09-19 DIAGNOSIS — D123 Benign neoplasm of transverse colon: Secondary | ICD-10-CM | POA: Diagnosis not present

## 2019-09-19 DIAGNOSIS — D122 Benign neoplasm of ascending colon: Secondary | ICD-10-CM | POA: Diagnosis not present

## 2019-09-19 DIAGNOSIS — Z8371 Family history of colonic polyps: Secondary | ICD-10-CM

## 2019-09-19 MED ORDER — SODIUM CHLORIDE 0.9 % IV SOLN: 500.0000 mL | Freq: Once | INTRAVENOUS | Status: DC

## 2019-09-19 NOTE — Progress Notes (Signed)
Called to room to assist during endoscopic procedure.  Patient ID and intended procedure confirmed with present staff. Received instructions for my participation in the procedure from the performing physician.  

## 2019-09-19 NOTE — Progress Notes (Signed)
Pt's states no medical or surgical changes since previsit or office visit. 

## 2019-09-19 NOTE — Patient Instructions (Signed)
Handouts Provided:  Polyps and High Fiber Diet ? ?YOU HAD AN ENDOSCOPIC PROCEDURE TODAY AT THE [facility]:   Refer to the procedure report that was given to you for any specific questions about what was found during the examination.  If the procedure report does not answer your questions, please call your gastroenterologist to clarify.  If you requested that your care partner not be given the details of your procedure findings, then the procedure report has been included in a sealed envelope for you to review at your convenience later. ? ?YOU SHOULD EXPECT: Some feelings of bloating in the abdomen. Passage of more gas than usual.  Walking can help get rid of the air that was put into your GI tract during the procedure and reduce the bloating. If you had a lower endoscopy (such as a colonoscopy or flexible sigmoidoscopy) you may notice spotting of blood in your stool or on the toilet paper. If you underwent a bowel prep for your procedure, you may not have a normal bowel movement for a few days. ? ?Please Note:  You might notice some irritation and congestion in your nose or some drainage.  This is from the oxygen used during your procedure.  There is no need for concern and it should clear up in a day or so. ? ?SYMPTOMS TO REPORT IMMEDIATELY: ? ?Following lower endoscopy (colonoscopy or flexible sigmoidoscopy): ? Excessive amounts of blood in the stool ? Significant tenderness or worsening of abdominal pains ? Swelling of the abdomen that is new, acute ? Fever of 100?F or higher ? ?For urgent or emergent issues, a gastroenterologist can be reached at any hour by calling [phone]. ?Do not use MyChart messaging for urgent concerns.  ? ? ?DIET:  We do recommend a small meal at first, but then you may proceed to your regular diet.  Drink plenty of fluids but you should avoid alcoholic beverages for 24 hours. ? ?ACTIVITY:  You should plan to take it easy for the rest of today and you should NOT DRIVE  or use heavy machinery until tomorrow (because of the sedation medicines used during the test).   ? ?FOLLOW UP: ?Our staff will call the number listed on your records 48-72 hours following your procedure to check on you and address any questions or concerns that you may have regarding the information given to you following your procedure. If we do not reach you, we will leave a message.  We will attempt to reach you two times.  During this call, we will ask if you have developed any symptoms of COVID 19. If you develop any symptoms (ie: fever, flu-like symptoms, shortness of breath, cough etc.) before then, please call [phone].  If you test positive for Covid 19 in the 2 weeks post procedure, please call and report this information to us.   ? ?If any biopsies were taken you will be contacted by phone or by letter within the next 1-3 weeks.  Please call us at [phone] if you have not heard about the biopsies in 3 weeks.  ? ? ?SIGNATURES/CONFIDENTIALITY: ?You and/or your care partner have signed paperwork which will be entered into your electronic medical record.  These signatures attest to the fact that that the information above on your After Visit Summary has been reviewed and is understood.  Full responsibility of the confidentiality of this discharge information lies with you and/or your care-partner. ? ?

## 2019-09-19 NOTE — Progress Notes (Signed)
A/ox3, pleased with MAC, report to RN 

## 2019-09-19 NOTE — Progress Notes (Signed)
Temperature taken by L.C., VS taken by D.T. 

## 2019-09-21 ENCOUNTER — Telehealth: Payer: Self-pay | Admitting: *Deleted

## 2019-09-21 ENCOUNTER — Telehealth: Payer: Self-pay

## 2019-09-21 NOTE — Telephone Encounter (Signed)
  Follow up Call-  Call back number 09/19/2019  Post procedure Call Back phone  # [phone]  Permission to leave phone message Yes  Some recent data might be hidden     Patient questions:  Do you have a fever, pain , or abdominal swelling? No. Pain Score  0 *  Have you tolerated food without any problems? Yes.    Have you been able to return to your normal activities? Yes.    Do you have any questions about your discharge instructions: Diet   No. Medications  No. Follow up visit  No.  Do you have questions or concerns about your Care? No.  Actions: * If pain score is 4 or above: No action needed, pain <4.  1. Have you developed a fever since your procedure? No  2.   Have you had an respiratory symptoms (SOB or cough) since your procedure? No  3.   Have you tested positive for COVID 19 since your procedure No  4.   Have you had any family members/close contacts diagnosed with the COVID 19 since your procedure?  No   If yes to any of these questions please route to Joylene John, RN and Erenest Rasher, RN

## 2019-09-21 NOTE — Telephone Encounter (Signed)
Left message

## 2019-09-25 ENCOUNTER — Institutional Professional Consult (permissible substitution): Payer: 59 | Admitting: Internal Medicine

## 2019-10-01 ENCOUNTER — Other Ambulatory Visit: Payer: Self-pay | Admitting: Family Medicine

## 2019-10-01 DIAGNOSIS — E786 Lipoprotein deficiency: Secondary | ICD-10-CM

## 2019-10-01 DIAGNOSIS — F419 Anxiety disorder, unspecified: Secondary | ICD-10-CM

## 2019-10-01 DIAGNOSIS — E785 Hyperlipidemia, unspecified: Secondary | ICD-10-CM

## 2019-10-04 ENCOUNTER — Encounter: Payer: Self-pay | Admitting: Gastroenterology

## 2019-10-12 ENCOUNTER — Other Ambulatory Visit: Payer: Self-pay | Admitting: Physician Assistant

## 2019-10-12 DIAGNOSIS — F419 Anxiety disorder, unspecified: Secondary | ICD-10-CM

## 2019-10-12 DIAGNOSIS — E785 Hyperlipidemia, unspecified: Secondary | ICD-10-CM

## 2019-10-12 DIAGNOSIS — E786 Lipoprotein deficiency: Secondary | ICD-10-CM

## 2019-10-12 MED ORDER — SERTRALINE HCL 50 MG PO TABS: 25.0000 mg | ORAL_TABLET | Freq: Every day | ORAL | 0 refills | Status: DC

## 2019-10-12 MED ORDER — ROSUVASTATIN CALCIUM 40 MG PO TABS: 40.0000 mg | ORAL_TABLET | Freq: Every day | ORAL | 0 refills | Status: DC

## 2019-10-12 NOTE — Telephone Encounter (Signed)
Refill sent to pharmacy. AS, CMA 

## 2019-10-12 NOTE — Telephone Encounter (Signed)
[facility] pharmacy rep called for authorization to refill these meds :  rosuvastatin (CRESTOR) 40 MG tablet [[id number]]   Order Details Dose: 40 mg Route: Oral Frequency: Daily at bedtime  Dispense Quantity: 90 tablet Refills: 0       Sig: Take 1 tablet (40 mg total) by mouth at bedtime.    &  sertraline (ZOLOFT) 50 MG tablet [[id number]]   Order Details Dose: 25 mg Route: Oral Frequency: Daily  Dispense Quantity: 90 tablet Refills: 0       Sig: Take 0.5 tablets (25 mg total) by mouth daily.        --Please send refill order to :    Order ref# :  [id number]  Preferred Pharmacies      [facility] [place] [place] [facility] [employer] [phone] (Phone) [phone] (Fax   --glh

## 2019-10-16 ENCOUNTER — Encounter: Payer: Self-pay | Admitting: Allergy

## 2019-10-16 ENCOUNTER — Other Ambulatory Visit: Payer: Self-pay

## 2019-10-16 ENCOUNTER — Ambulatory Visit (INDEPENDENT_AMBULATORY_CARE_PROVIDER_SITE_OTHER): Payer: No Typology Code available for payment source | Admitting: Allergy

## 2019-10-16 VITALS — BP 132/74 | HR 72 | Resp 16

## 2019-10-16 DIAGNOSIS — J3089 Other allergic rhinitis: Secondary | ICD-10-CM | POA: Diagnosis not present

## 2019-10-16 DIAGNOSIS — J4520 Mild intermittent asthma, uncomplicated: Secondary | ICD-10-CM

## 2019-10-16 MED ORDER — QVAR REDIHALER 40 MCG/ACT IN AERB: INHALATION_SPRAY | RESPIRATORY_TRACT | 1 refills | Status: DC

## 2019-10-16 NOTE — Patient Instructions (Signed)
Allergic rhinits Continue XHANCE 2 sprays each nostril twice a day to help with nasal congestion and pressure. Continue avoidance measures for dust mites and and pets  Mild intermittent asthma Continue Qvar 40 mcg-take 2 puffs twice a day.  May use albuterol 2 puffs every 4-6 hours as needed for wheezing, tightness in chest, or shortness of breath.  Also may use albuterol 5 to 15 minutes prior to exercise or strenuous activity. Asthma control goals:   Full participation in all desired activities (may need albuterol before activity)  Albuterol use two time or less a week on average (not counting use with activity)  Cough interfering with sleep two time or less a month  Oral steroids no more than once a year  No hospitalizations   Continue all other medications. Please let us know if this treatment plan is not working for you.  Schedule follow-up in 4 months

## 2019-10-16 NOTE — Progress Notes (Signed)
[address] [place]  [address] Dept: [phone]  FOLLOW UP NOTE  Patient ID: Elijah Small, male    DOB: 04-27-1966  Age: 54 y.o. MRN: [id number] Date of Office Visit: 10/16/2019  Assessment  Chief Complaint: Asthma  HPI Elijah Small is a 54 year old male that presents for follow-up of mild intermittent asthma and allergic rhinitis.  He was last seen on August 21, 2019 by Dr. Nelva Bush.  Asthma is reported as well controlled with the use of Qvar 40 mcg 2 puffs twice a day.  He denies any coughing, wheezing, tightness in his chest, or shortness of breath.  He has not used his albuterol inhaler any since his last office visit.  He also denies any nocturnal symptoms, trips to the emergency room/ urgent care or use systemic steroids since his last office visit.  Allergic rhinitis is reported as well controlled with the use of XHANCE nasal spray using 2 sprays each nostril twice a day.  He denies any rhinorrhea, nasal congestion, sinus tenderness and postnasal drip.   Drug Allergies:  No Known Allergies   Physical Exam: BP 132/74    Pulse 72    Resp 16    SpO2 97%    Physical Exam Constitutional:      Appearance: Normal appearance.  HENT:     Head: Normocephalic and atraumatic.     Comments: Pharynx normal. Eyes normal. Ears normal. Nose normal.    Right Ear: Tympanic membrane, ear canal and external ear normal.     Left Ear: Tympanic membrane, ear canal and external ear normal.     Nose: Nose normal.     Mouth/Throat:     Mouth: Mucous membranes are moist.     Pharynx: Oropharynx is clear.  Eyes:     Conjunctiva/sclera: Conjunctivae normal.  Cardiovascular:     Rate and Rhythm: Normal rate and regular rhythm.     Pulses: Normal pulses.     Heart sounds: Normal heart sounds.  Pulmonary:     Effort: Pulmonary effort is normal.     Breath sounds: Normal breath sounds.     Comments: Lungs clear to auscultation. Musculoskeletal:     Cervical back: Neck  supple.  Skin:    General: Skin is warm.  Neurological:     Mental Status: He is alert.  Psychiatric:        Mood and Affect: Mood normal.        Behavior: Behavior normal.        Thought Content: Thought content normal.        Judgment: Judgment normal.     Diagnostics: FVC 4.00, FEV1 3.29L. Predicted FVC 4.77, FEV1 3.67L. Spirometry indicates normal ventilatory function.   Assessment and Plan: 1. Perennial allergic rhinitis   2. Mild intermittent asthma without complication       Allergic rhinits Continue XHANCE 2 sprays each nostril twice a day to help with nasal congestion and pressure. Continue avoidance measures for dust mites and and pets  Mild intermittent asthma Continue Qvar 40 mcg-take 2 puffs twice a day.  May use albuterol 2 puffs every 4-6 hours as needed for wheezing, tightness in chest, or shortness of breath.  Also may use albuterol 5 to 15 minutes prior to exercise or strenuous activity. Asthma control goals:   Full participation in all desired activities (may need albuterol before activity)  Albuterol use two time or less a week on average (not counting use with activity)  Cough interfering with sleep two  time or less a month  Oral steroids no more than once a year  No hospitalizations   Continue all other medications. Please let us know if this treatment plan is not working for you.  Schedule follow-up in 4 months  Thank you for the opportunity to care for this patient.  Please do not hesitate to contact me with questions.  Althea Charon, FNP [facility]  Attestation:  I reviewed the Nurse Practitioner's note and agree with the documented findings and plan of care. We discussed the patient and developed a plan concurrently.   Prudy Feeler, MD [facility]

## 2019-10-24 ENCOUNTER — Other Ambulatory Visit: Payer: Self-pay

## 2019-10-24 ENCOUNTER — Ambulatory Visit (INDEPENDENT_AMBULATORY_CARE_PROVIDER_SITE_OTHER): Payer: No Typology Code available for payment source | Admitting: Pulmonary Disease

## 2019-10-24 ENCOUNTER — Encounter: Payer: Self-pay | Admitting: Pulmonary Disease

## 2019-10-24 VITALS — BP 126/78 | HR 72 | Temp 98.4°F | Ht 69.0 in | Wt 255.2 lb

## 2019-10-24 DIAGNOSIS — J4520 Mild intermittent asthma, uncomplicated: Secondary | ICD-10-CM | POA: Diagnosis not present

## 2019-10-24 DIAGNOSIS — G4733 Obstructive sleep apnea (adult) (pediatric): Secondary | ICD-10-CM

## 2019-10-24 DIAGNOSIS — Z9989 Dependence on other enabling machines and devices: Secondary | ICD-10-CM | POA: Diagnosis not present

## 2019-10-24 NOTE — Patient Instructions (Signed)
Schedule home sleep test. Based on this, we will get you new CPAP machine set at 14 cm

## 2019-10-24 NOTE — Assessment & Plan Note (Signed)
Stable on his current regimen of Qvar as needed seasonally.  Has not needed albuterol in the last 3 months

## 2019-10-24 NOTE — Progress Notes (Signed)
Subjective:    Patient ID: Elijah Small, male    DOB: [birth year], 54 y.o.   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 He was diagnosed 10 years ago by a study done [facility] and placed on CPAP of 14 cm with nasal mask with good improvement in his daytime somnolence and fatigue.  When his machine started malfunctioning, he obtained a mini travel CPAP but being out-of-pocket.  He is now ready to get a new machine.  He is very compliant and not imagine sleeping without his machine. Epworth sleepiness score is 3 and he denies problems driving. Bedtime is between 7 PM and midnight, sleep latency is 10 to 15 minutes, he sleeps on his side with 1 pillow, reports minimal awakenings and is out of bed at 8:30 AM feeling rested without dryness of mouth or headaches.  His weight is fluctuated within 5 to 10 pounds over the last few years. There is no history suggestive of cataplexy, sleep paralysis or parasomnias  He relies on his room humidifier, does not have humidity on his travel machine.  He was evaluated in our office 09/2017 and felt to have cough variant asthma, placed on Symbicort. Since then he has been diagnosed with perennial allergies and sees Dr. Nelva Bush, allergist and is maintained on a regimen of Qvar which he only uses seasonally and albuterol.  Significant tests/ events reviewed  Spirometry 08/2019 no airway obstruction, ratio 81, FEV1 86%, FVC 82%    Past Medical History:  Diagnosis Date  . Anxiety   . Asthma   . Diverticulitis   . GERD (gastroesophageal reflux disease)   . History of colon polyps   . Hypercholesteremia   . Sleep apnea    CPAP  . Thyroid disease    Past Surgical History:  Procedure Laterality Date  . COLECTOMY  2208   12 inches - Dr. [facility]  . COLOSTOMY  2009   colectomy   . KNEE SURGERY Bilateral     No Known Allergies  Social History    Socioeconomic History  . Marital status: Married    Spouse name: Not on file  . Number of children: Not on file  . Years of education: Not on file  . Highest education level: Not on file  Occupational History  . Not on file  Tobacco Use  . Smoking status: Never Smoker  . Smokeless tobacco: Current User    Types: Snuff  Vaping Use  . Vaping Use: Never used  Substance and Sexual Activity  . Alcohol use: No  . Drug use: No  . Sexual activity: Not on file  Other Topics Concern  . Not on file  Social History Narrative   Marital Status: Married Vinnie Level)   Children: Son (1), Daughters (2)    Pets: Dogs (4)     Living Situation: Lives with wife and kids.   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 Difficulty of Paying Living Expenses:   Food Insecurity:   . Worried About Charity fundraiser in the Last Year:   .  Ran Out of Food in the Last Year:   Transportation Needs:   . Film/video editor (Medical):   Marland Kitchen Lack of Transportation (Non-Medical):   Physical Activity:   . Days of Exercise per Week:   . Minutes of Exercise per Session:   Stress:   . Feeling of Stress :   Social Connections:   . Frequency of Communication with Friends and Family:   . Frequency of Social Gatherings with Friends and Family:   . Attends Religious Services:   . Active Member of Clubs or Organizations:   . Attends Archivist Meetings:   Marland Kitchen Marital Status:   Intimate Partner Violence:   . Fear of Current or Ex-Partner:   . Emotionally Abused:   Marland Kitchen Physically Abused:   . Sexually Abused:      Family History  Problem Relation Age of Onset  . Hypertension Father   . Heart disease Father        He died at 93 from MI    . Colon polyps Father        age of onset unknown  . Stroke Mother   . Dementia Mother   . Breast cancer Mother   . Cancer Brother        thyroid  . Diabetes Brother   . Hypertension Brother   . Asthma Son   . Allergic rhinitis Son   . Asthma Daughter   . Allergic rhinitis Daughter   . Colon cancer Paternal Uncle        early 73s  . Esophageal cancer Neg Hx   . Rectal cancer Neg Hx   . Stomach cancer Neg Hx      Review of Systems Constitutional: negative for anorexia, fevers and sweats  Eyes: negative for irritation, redness and visual disturbance  Ears, nose, mouth, throat, and face: negative for earaches, epistaxis,  and sore throat + nasal congestion Respiratory: negative for cough, dyspnea on exertion, sputum + wheezing occ Cardiovascular: negative for chest pain, dyspnea, lower extremity edema, orthopnea, palpitations and syncope  Gastrointestinal: negative for abdominal pain, constipation, diarrhea, melena, nausea and vomiting  Genitourinary:negative for dysuria, frequency and hematuria  Hematologic/lymphatic: negative for bleeding, easy bruising and lymphadenopathy  Musculoskeletal:negative for arthralgias, muscle weakness and stiff joints  Neurological: negative for coordination problems, gait problems, headaches and weakness  Endocrine: negative for diabetic symptoms including polydipsia, polyuria and weight loss     Objective:   Physical Exam  Gen. Pleasant, obese, in no distress, normal affect ENT - no pallor,icterus, no post nasal drip, class 2-3 airway Neck: No JVD, no thyromegaly, no carotid bruits Lungs: no use of accessory muscles, no dullness to percussion, decreased without rales or rhonchi  Cardiovascular: Rhythm regular, heart sounds  normal, no murmurs or gallops, no peripheral edema Abdomen: soft and non-tender, no hepatosplenomegaly, BS normal. Musculoskeletal: No deformities, no cyanosis or clubbing Neuro:  alert, non focal, no tremors        Assessment & Plan:

## 2019-10-24 NOTE — Assessment & Plan Note (Signed)
Schedule home sleep test. Based on this, we will get you new CPAP machine set at 14 cm He seems to be very compliant by history and CPAP is certainly helped improve his daytime somnolence and fatigue. We discussed concerns regarding the recent Respironics recall  Weight loss encouraged, compliance with goal of at least 4-6 hrs every night is the expectation. Advised against medications with sedative side effects Cautioned against driving when sleepy - understanding that sleepiness will vary on a day to day basis

## 2019-12-04 ENCOUNTER — Other Ambulatory Visit: Payer: Self-pay | Admitting: Family Medicine

## 2019-12-04 DIAGNOSIS — E039 Hypothyroidism, unspecified: Secondary | ICD-10-CM

## 2019-12-05 ENCOUNTER — Other Ambulatory Visit: Payer: Self-pay

## 2019-12-05 ENCOUNTER — Ambulatory Visit: Payer: No Typology Code available for payment source

## 2019-12-05 DIAGNOSIS — G4733 Obstructive sleep apnea (adult) (pediatric): Secondary | ICD-10-CM | POA: Diagnosis not present

## 2019-12-10 ENCOUNTER — Telehealth: Payer: Self-pay | Admitting: Pulmonary Disease

## 2019-12-10 DIAGNOSIS — G4733 Obstructive sleep apnea (adult) (pediatric): Secondary | ICD-10-CM | POA: Diagnosis not present

## 2019-12-10 NOTE — Telephone Encounter (Signed)
HST showed severe OSA, AHI 40/hour Please send prescription to DME for CPAP 14 cm, mask of choice.  Office visit in 6 weeks with APP/me to reassess

## 2019-12-11 ENCOUNTER — Encounter: Payer: Self-pay | Admitting: Pulmonary Disease

## 2019-12-11 ENCOUNTER — Other Ambulatory Visit: Payer: Self-pay | Admitting: Pulmonary Disease

## 2019-12-11 DIAGNOSIS — G4733 Obstructive sleep apnea (adult) (pediatric): Secondary | ICD-10-CM

## 2019-12-11 NOTE — Progress Notes (Signed)
dme

## 2019-12-17 ENCOUNTER — Other Ambulatory Visit: Payer: Self-pay | Admitting: Pulmonary Disease

## 2019-12-27 ENCOUNTER — Encounter: Payer: Self-pay | Admitting: Pulmonary Disease

## 2020-01-04 NOTE — Telephone Encounter (Signed)
mychart message sent by pt which is posted below:   To: [facility] POOL    From: RHYDIAN BALDI    Created: 01/04/2020 10:33 AM     *-*-*This message was handled on 01/04/2020 10:35 AM by Catarina Huntley P*-*-*  I was wondering when I should expect to be contacted about getting my new CPAP.        PCCs, please advise. Order was placed 12/11/19.

## 2020-01-04 NOTE — Telephone Encounter (Signed)
I called [place] spoke to [facility].  She states they have called pt twice & left vm's with no response.  She was going to hang up from me and try him again.  Will route message back to triage so nurse can let pt know thru MyChart to call [facility] if he hasn't heard from them.

## 2020-01-07 ENCOUNTER — Telehealth: Payer: Self-pay | Admitting: Physician Assistant

## 2020-01-07 DIAGNOSIS — E039 Hypothyroidism, unspecified: Secondary | ICD-10-CM

## 2020-01-07 MED ORDER — LEVOTHYROXINE SODIUM 75 MCG PO TABS: 75.0000 ug | ORAL_TABLET | Freq: Every day | ORAL | 0 refills | Status: DC

## 2020-01-07 NOTE — Telephone Encounter (Signed)
#  30 day supply of med sent to requested pharmacy. Patient is aware future refills will need to come for new PCP. AS, CMA

## 2020-01-07 NOTE — Telephone Encounter (Signed)
Patient needs a refill on synthroid. He is scheduled to be a new patient on 9/21 at another clinic. Please advice.

## 2020-01-07 NOTE — Addendum Note (Signed)
Addended by: Mickel Crow on: 01/07/2020 11:08 AM   Modules accepted: Orders

## 2020-01-16 ENCOUNTER — Encounter: Payer: Self-pay | Admitting: Allergy

## 2020-01-28 ENCOUNTER — Other Ambulatory Visit: Payer: Self-pay | Admitting: *Deleted

## 2020-01-28 MED ORDER — XHANCE 93 MCG/ACT NA EXHU: INHALANT_SUSPENSION | NASAL | 2 refills | Status: DC

## 2020-01-29 ENCOUNTER — Encounter: Payer: Self-pay | Admitting: Legal Medicine

## 2020-01-29 ENCOUNTER — Other Ambulatory Visit: Payer: Self-pay

## 2020-01-29 ENCOUNTER — Ambulatory Visit: Payer: No Typology Code available for payment source | Admitting: Legal Medicine

## 2020-01-29 VITALS — BP 132/72 | HR 64 | Temp 97.9°F | Ht 68.0 in | Wt 257.0 lb

## 2020-01-29 DIAGNOSIS — F419 Anxiety disorder, unspecified: Secondary | ICD-10-CM

## 2020-01-29 DIAGNOSIS — J4520 Mild intermittent asthma, uncomplicated: Secondary | ICD-10-CM

## 2020-01-29 DIAGNOSIS — Z6839 Body mass index (BMI) 39.0-39.9, adult: Secondary | ICD-10-CM | POA: Diagnosis not present

## 2020-01-29 DIAGNOSIS — K219 Gastro-esophageal reflux disease without esophagitis: Secondary | ICD-10-CM | POA: Diagnosis not present

## 2020-01-29 DIAGNOSIS — Z23 Encounter for immunization: Secondary | ICD-10-CM | POA: Diagnosis not present

## 2020-01-29 DIAGNOSIS — E785 Hyperlipidemia, unspecified: Secondary | ICD-10-CM

## 2020-01-29 DIAGNOSIS — E786 Lipoprotein deficiency: Secondary | ICD-10-CM

## 2020-01-29 DIAGNOSIS — R7303 Prediabetes: Secondary | ICD-10-CM

## 2020-01-29 DIAGNOSIS — E039 Hypothyroidism, unspecified: Secondary | ICD-10-CM

## 2020-01-29 MED ORDER — ROSUVASTATIN CALCIUM 40 MG PO TABS: 40.0000 mg | ORAL_TABLET | Freq: Every day | ORAL | 0 refills | Status: DC

## 2020-01-29 MED ORDER — SERTRALINE HCL 50 MG PO TABS: 50.0000 mg | ORAL_TABLET | Freq: Every day | ORAL | 2 refills | Status: DC

## 2020-01-29 MED ORDER — ALBUTEROL SULFATE HFA 108 (90 BASE) MCG/ACT IN AERS: 2.0000 | INHALATION_SPRAY | Freq: Four times a day (QID) | RESPIRATORY_TRACT | 6 refills | Status: DC | PRN

## 2020-01-29 MED ORDER — XHANCE 93 MCG/ACT NA EXHU: INHALANT_SUSPENSION | NASAL | 2 refills | Status: DC

## 2020-01-29 MED ORDER — LEVOTHYROXINE SODIUM 75 MCG PO TABS: 75.0000 ug | ORAL_TABLET | Freq: Every day | ORAL | 0 refills | Status: DC

## 2020-01-29 MED ORDER — QVAR REDIHALER 40 MCG/ACT IN AERB: INHALATION_SPRAY | RESPIRATORY_TRACT | 3 refills | Status: DC

## 2020-01-29 NOTE — Progress Notes (Signed)
New Patient Office Visit  Subjective:  Patient ID: Elijah Small, male    DOB: [birth year]  Age: 54 y.o. MRN: [id number]  CC:  Chief Complaint  Patient presents with  . New to establish    Needs medication refills    HPI Elijah Small presents for to establish  Patient has moderate intermittant asthma uncomplicated.  Asthma was diagnosed adult occSIONAL and has occasional daytime episodes and occasiona night symptoms.  Patient is no asthma today and is using albuterol and beclomethasone.  Patient does nt smoke.  None  Patient presents with hyperlipidemia.  Compliance with treatment has been good; patient takes medicines as directed, maintains low cholesterol diet, follows up as directed, and maintains exercise regimen.  Patient is using crestor without problems.  Patient has HYPOTHYROIDISM.  Diagnosed 10 years ago.  Patient has stable thyroid readings.  Patient is having no symptoms.  Last TSH was none.  continue dosage of thyroid medicine.  Low vitamin D  This patient has major depression for 10 years.  .  Patient is having less anhedonia.  The patient has improving future plans and prospects.  The depression is worse with stress.  The patient is not exercising and working on behavior to improve mental health.  Patient is not seeing a therapist or psychiatrist.  none  Patient is on sertraline 50mg  qd.  Past Medical History:  Diagnosis Date  . Anxiety   . Diverticulitis   . History of colon polyps   . Mixed hyperlipidemia     Past Surgical History:  Procedure Laterality Date  . COLECTOMY  2208   12 inches - Dr. [facility]  . COLOSTOMY  2009   colectomy   . KNEE SURGERY Bilateral     Family History  Problem Relation Age of Onset  . Hypertension Father   . Heart disease Father        He died at 35 from MI   . Colon polyps Father        age of onset unknown  . Stroke Mother   . Dementia Mother   . Breast cancer Mother   . Cancer Brother         thyroid  . Diabetes Brother   . Hypertension Brother   . Asthma Son   . Allergic rhinitis Son   . Asthma Daughter   . Allergic rhinitis Daughter   . Colon cancer Paternal Uncle        early 33s  . Esophageal cancer Neg Hx   . Rectal cancer Neg Hx   . Stomach cancer Neg Hx     Social History   Socioeconomic History  . Marital status: Married    Spouse name: Not on file  . Number of children: 3  . Years of education: Not on file  . Highest education level: Not on file  Occupational History  . Occupation: Therapist, nutritional  Tobacco Use  . Smoking status: Never Smoker  . Smokeless tobacco: Current User    Types: Snuff  Vaping Use  . Vaping Use: Never used  Substance and Sexual Activity  . Alcohol use: No  . Drug use: No  . Sexual activity: Not on file  Other Topics Concern  . Not on file  Social History Narrative   Marital Status: Married Vinnie [place])   Children: Son (1), Daughters (2)    Pets: Dogs (4)     Living Situation: Lives with wife and kids.   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 Difficulty of Paying Living Expenses: Not on file  Food Insecurity:   . Worried About Charity fundraiser in the Last Year: Not on file  . Ran Out of Food in the Last Year: Not on file  Transportation Needs:   . Lack of Transportation (Medical): Not on file  . Lack of Transportation (Non-Medical): Not on file  Physical Activity:   . Days of Exercise per Week: Not on file  . Minutes of Exercise per Session: Not on file  Stress:   . Feeling of Stress : Not on file  Social Connections:   . Frequency of Communication with Friends and Family: Not on file  . Frequency of Social Gatherings with Friends and Family: Not on  file  . Attends Religious Services: Not on file  . Active Member of Clubs or Organizations: Not on file  . Attends Archivist Meetings: Not on file  . Marital Status: Not on file  Intimate Partner Violence:   . Fear of Current or Ex-Partner: Not on file  . Emotionally Abused: Not on file  . Physically Abused: Not on file  . Sexually Abused: Not on file    ROS Review of Systems  Constitutional: Negative.   HENT: Negative.   Respiratory: Negative.   Cardiovascular: Negative for chest pain, palpitations and leg swelling.  Gastrointestinal: Negative.   Musculoskeletal: Positive for arthralgias (knees).  Skin: Negative.   Neurological: Negative.   Psychiatric/Behavioral: Negative.     Objective:   Today's Vitals: BP 132/72 (BP Location: Right Arm, Patient Position: Sitting)   Pulse 64   Temp 97.9 F (36.6 C) (Temporal)   Ht 5\' 8"  (1.727 m)   Wt 257 lb (116.6 kg)   SpO2 99%   BMI 39.08 kg/m   Physical Exam Vitals reviewed.  Constitutional:      Appearance: Normal appearance.  HENT:     Head: Normocephalic and atraumatic.     Right Ear: Tympanic membrane, ear canal and external ear normal.     Left Ear: Tympanic membrane, ear canal and external ear normal.  Eyes:     Conjunctiva/sclera: Conjunctivae normal.     Pupils: Pupils are equal, round, and reactive to light.  Cardiovascular:     Rate and Rhythm: Normal rate and regular rhythm.     Pulses: Normal pulses.     Heart sounds: Normal heart sounds.  Pulmonary:     Effort: Pulmonary effort is normal.     Breath sounds: Normal breath sounds.  Abdominal:     General: Abdomen is flat. Bowel sounds are normal.     Palpations: Abdomen is soft.  Musculoskeletal:        General: Normal range of motion.     Cervical back: Neck supple.  Skin:    General: Skin is warm and dry.     Capillary Refill: Capillary refill takes less than 2 seconds.  Neurological:     General: No focal deficit present.     Mental  Status: He is alert. Mental status is at baseline.  Psychiatric:        Mood and Affect:  Mood normal.        Thought Content: Thought content normal.        Judgment: Judgment normal.     Assessment & Plan:   Problem List Items Addressed This Visit      Respiratory   Mild intermittent asthma   Relevant Medications   beclomethasone (QVAR REDIHALER) 40 MCG/ACT inhaler   albuterol (VENTOLIN HFA) 108 (90 Base) MCG/ACT inhaler This patient has asthma moderate and is on beclovent and albuterol.  Patient is not having a flair.  Chronic medicines include beclovent. Addition new medicines none.  Asthma action plan is in place.      Digestive   Gastroesophageal reflux disease without esophagitis Plan of care was formulated today.  She is doing well.  A plan of care was formulated using patient exam, tests and other sources to optimize care using evidence based information.  Recommend no smoking, no eating after supper, avoid fatty foods, elevate Head of bed, avoid tight fitting clothing.  Continue on OTC.     Endocrine   Acquired hypothyroidism - Primary   Relevant Medications   levothyroxine (SYNTHROID) 75 MCG tablet   Other Relevant Orders   TSH Patient is known to have hypothyroid and is n treatment with levothyroxine 68mcg.  Patient was diagnosed 10 years ago.  Other treatment includes none.  Patient is compliant with medicines and last TSH 6 months ago.  Last TSH was normal.     Other   Morbid obesity (HCC) (Chronic)  An individualize plan was formulated for obesity using patient history and physical exam to encourage weight loss.  An evidence based program was formulated.  Patient is to cut portion size with meals and to plan physical exercise 3 days a week at least 20 minutes.  Weight watchers and other programs are helpful.  Planned amount of weight loss 10 lbs.    Anxiety   Relevant Medications   sertraline (ZOLOFT) 50 MG tablet AN INDIVIDUAL CARE PLAN for anxiety was  established and reinforced today.  The patient's status was assessed using clinical findings on exam, labs, and other diagnostic testing. Patient's success at meeting treatment goals based on disease specific evidence-bassed guidelines and found to be in good control. RECOMMENDATIONS include maintaining present medicines and treatment.    Prediabetes   Relevant Orders   Hemoglobin A1c Patient is prediabetic and needs to watch diet and weight            BMI 39.0-39.9,adult An individualize plan was formulated for obesity using patient history and physical exam to encourage weight loss.  An evidence based program was formulated.  Patient is to cut portion size with meals and to plan physical exercise 3 days a week at least 20 minutes.  Weight watchers and other programs are helpful.  Planned amount of weight loss 10 lbs.    Other Visit Diagnoses    Hyperlipidemia, unspecified hyperlipidemia type       Relevant Medications   rosuvastatin (CRESTOR) 40 MG tablet   Other Relevant Orders   CBC with Differential/Platelet   Comprehensive metabolic panel   Lipid panel AN INDIVIDUAL CARE PLAN for hyperlipidemia/ cholesterol was established and reinforced today.  The patient's status was assessed using clinical findings on exam, lab and other diagnostic tests. The patient's disease status was assessed based on evidence-based guidelines and found to be well controlled. MEDICATIONS were reviewed. SELF MANAGEMENT GOALS have been discussed and patient's success at attaining the goal of low cholesterol was assessed. RECOMMENDATION given  include regular exercise 3 days a week and low cholesterol/low fat diet. CLINICAL SUMMARY including written plan to identify barriers unique to the patient due to social or economic  reasons was discussed.    Encounter for immunization       Relevant Orders   Flu Vaccine MDCK QUAD PF (Completed)      Outpatient Encounter Medications as of 01/29/2020  Medication Sig    . albuterol (VENTOLIN HFA) 108 (90 Base) MCG/ACT inhaler Inhale 2 puffs into the lungs every 6 (six) hours as needed for wheezing or shortness of breath.  . beclomethasone (QVAR REDIHALER) 40 MCG/ACT inhaler Inhale two doses twice daily to prevent cough or wheeze.  Rinse, gargle, and spit after use.  . Cholecalciferol (VITAMIN D3) 50 MCG (2000 UT) TABS Take 2 tablets by mouth daily.  . Fluticasone Propionate (XHANCE) 93 MCG/ACT EXHU Use two puffs in each nostril twice daily  . levothyroxine (SYNTHROID) 75 MCG tablet Take 1 tablet (75 mcg total) by mouth daily before breakfast. FUTURE REFILLS WILL NEED TO BE PROVIDED BY NEW PCP  . rosuvastatin (CRESTOR) 40 MG tablet Take 1 tablet (40 mg total) by mouth at bedtime.  . sertraline (ZOLOFT) 50 MG tablet Take 1 tablet (50 mg total) by mouth daily.  . [DISCONTINUED] albuterol (PROVENTIL HFA;VENTOLIN HFA) 108 (90 Base) MCG/ACT inhaler Inhale 2 puffs into the lungs every 6 (six) hours as needed for wheezing or shortness of breath.  . [DISCONTINUED] beclomethasone (QVAR REDIHALER) 40 MCG/ACT inhaler Inhale two doses twice daily to prevent cough or wheeze.  Rinse, gargle, and spit after use.  . [DISCONTINUED] Fluticasone Propionate (XHANCE) 93 MCG/ACT EXHU Use two puffs in each nostril twice daily  . [DISCONTINUED] levothyroxine (SYNTHROID) 75 MCG tablet Take 1 tablet (75 mcg total) by mouth daily before breakfast. FUTURE REFILLS WILL NEED TO BE PROVIDED BY NEW PCP  . [DISCONTINUED] rosuvastatin (CRESTOR) 40 MG tablet Take 1 tablet (40 mg total) by mouth at bedtime.  . [DISCONTINUED] sertraline (ZOLOFT) 50 MG tablet Take 0.5 tablets (25 mg total) by mouth daily. (Patient taking differently: Take 50 mg by mouth daily. )   No facility-administered encounter medications on file as of 01/29/2020.    Follow-up: Return in about 6 months (around 07/28/2020) for fasting.   Reinaldo Meeker, MD

## 2020-01-30 LAB — LIPID PANEL
Chol/HDL Ratio: 5.2 ratio — ABNORMAL HIGH (ref 0.0–5.0)
Cholesterol, Total: 191 mg/dL (ref 100–199)
HDL: 37 mg/dL — ABNORMAL LOW (ref >39)
LDL Chol Calc (NIH): 140 mg/dL — ABNORMAL HIGH (ref 0–99)
Triglycerides: 75 mg/dL (ref 0–149)
VLDL Cholesterol Cal: 14 mg/dL (ref 5–40)

## 2020-01-30 LAB — COMPREHENSIVE METABOLIC PANEL
ALT: 46 IU/L — ABNORMAL HIGH (ref 0–44)
AST: 28 IU/L (ref 0–40)
Albumin/Globulin Ratio: 1.8 (ref 1.2–2.2)
Albumin: 4.2 g/dL (ref 3.8–4.9)
Alkaline Phosphatase: 62 IU/L (ref 44–121)
BUN/Creatinine Ratio: 10 (ref 9–20)
BUN: 11 mg/dL (ref 6–24)
Bilirubin Total: 0.4 mg/dL (ref 0.0–1.2)
CO2: 24 mmol/L (ref 20–29)
Calcium: 9.4 mg/dL (ref 8.7–10.2)
Chloride: 102 mmol/L (ref 96–106)
Creatinine, Ser: 1.07 mg/dL (ref 0.76–1.27)
GFR calc Af Amer: 90 mL/min/{1.73_m2} (ref >59)
GFR calc non Af Amer: 78 mL/min/{1.73_m2} (ref >59)
Globulin, Total: 2.4 g/dL (ref 1.5–4.5)
Glucose: 116 mg/dL — ABNORMAL HIGH (ref 65–99)
Potassium: 5 mmol/L (ref 3.5–5.2)
Sodium: 139 mmol/L (ref 134–144)
Total Protein: 6.6 g/dL (ref 6.0–8.5)

## 2020-01-30 LAB — CBC WITH DIFFERENTIAL/PLATELET
Basophils Absolute: 0 10*3/uL (ref 0.0–0.2)
Basos: 1 %
EOS (ABSOLUTE): 0.3 10*3/uL (ref 0.0–0.4)
Eos: 5 %
Hematocrit: 41.1 % (ref 37.5–51.0)
Hemoglobin: 13.7 g/dL (ref 13.0–17.7)
Immature Grans (Abs): 0 10*3/uL (ref 0.0–0.1)
Immature Granulocytes: 1 %
Lymphocytes Absolute: 1.4 10*3/uL (ref 0.7–3.1)
Lymphs: 25 %
MCH: 29.1 pg (ref 26.6–33.0)
MCHC: 33.3 g/dL (ref 31.5–35.7)
MCV: 87 fL (ref 79–97)
Monocytes Absolute: 0.6 10*3/uL (ref 0.1–0.9)
Monocytes: 11 %
Neutrophils Absolute: 3.2 10*3/uL (ref 1.4–7.0)
Neutrophils: 57 %
Platelets: 146 10*3/uL — ABNORMAL LOW (ref 150–450)
RBC: 4.71 x10E6/uL (ref 4.14–5.80)
RDW: 12.3 % (ref 11.6–15.4)
WBC: 5.4 10*3/uL (ref 3.4–10.8)

## 2020-01-30 LAB — HEMOGLOBIN A1C
Est. average glucose Bld gHb Est-mCnc: 143 mg/dL
Hgb A1c MFr Bld: 6.6 % — ABNORMAL HIGH (ref 4.8–5.6)

## 2020-01-30 LAB — TSH: TSH: 4.04 u[IU]/mL (ref 0.450–4.500)

## 2020-01-30 LAB — CARDIOVASCULAR RISK ASSESSMENT

## 2020-01-30 NOTE — Progress Notes (Signed)
Platelets slightly low, rest of cbc normal, glucose 116, kidney tests nonral, one liver tests slightly up, LDL-c high 140 is he on crestor, A1c 6.6 ok, TSH 4.04 good lp

## 2020-02-07 ENCOUNTER — Ambulatory Visit: Payer: 59 | Admitting: Physician Assistant

## 2020-02-11 ENCOUNTER — Encounter: Payer: Self-pay | Admitting: Legal Medicine

## 2020-02-11 NOTE — Telephone Encounter (Signed)
Call [facility] and tell them we called in a new prescription dose and they need to fill it. As new dosage lp

## 2020-02-19 ENCOUNTER — Ambulatory Visit: Payer: No Typology Code available for payment source | Admitting: Allergy

## 2020-02-28 ENCOUNTER — Other Ambulatory Visit: Payer: Self-pay

## 2020-02-28 ENCOUNTER — Encounter: Payer: Self-pay | Admitting: Pulmonary Disease

## 2020-02-28 ENCOUNTER — Ambulatory Visit: Payer: No Typology Code available for payment source | Admitting: Pulmonary Disease

## 2020-02-28 DIAGNOSIS — J45991 Cough variant asthma: Secondary | ICD-10-CM | POA: Diagnosis not present

## 2020-02-28 DIAGNOSIS — G4733 Obstructive sleep apnea (adult) (pediatric): Secondary | ICD-10-CM | POA: Diagnosis not present

## 2020-02-28 NOTE — Patient Instructions (Signed)
CPAP is working well on 14 cm. CPAP supplies will be renewed for a year

## 2020-02-28 NOTE — Assessment & Plan Note (Signed)
CPAP download was reviewed which shows excellent control of events on 14 cm with great compliance more than 8 hours every night.  He does have a slight leak but this is not bothering him. He likes the nasal mask. CPAP recently helped improve his daytime somnolence and fatigue.  Weight loss encouraged, compliance with goal of at least 4-6 hrs every night is the expectation. Advised against medications with sedative side effects Cautioned against driving when sleepy - understanding that sleepiness will vary on a day to day basis

## 2020-02-28 NOTE — Progress Notes (Signed)
° °  Subjective:    Patient ID: Elijah Small, male    DOB: 06/19/1965, 54 y.o.   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 DME: [facility]   Asthma is well controlled, denies nocturnal symptoms.  He uses Qvar only during spring and fall.  After his last visit we obtain a home sleep test.  We reviewed test results today which showed severe OSA not related to body position.  Based on this we got him a new CPAP machine he really likes this, he prefers a nasal mask.  He does have a leak but this is not waking him up     Significant tests/ events reviewed  HST 10/2019 >> severe OSA, AHI 40/hour Spirometry 08/2019 no airway obstruction, ratio 81, FEV1 86%, FVC 82%    Review of Systems neg for any significant sore throat, dysphagia, itching, sneezing, nasal congestion or excess/ purulent secretions, fever, chills, sweats, unintended wt loss, pleuritic or exertional cp, hempoptysis, orthopnea pnd or change in chronic leg swelling. Also denies presyncope, palpitations, heartburn, abdominal pain, nausea, vomiting, diarrhea or change in bowel or urinary habits, dysuria,hematuria, rash, arthralgias, visual complaints, headache, numbness weakness or ataxia.     Objective:   Physical Exam   Gen. Pleasant, obese, in no distress ENT - no lesions, no post nasal drip Neck: No JVD, no thyromegaly, no carotid bruits Lungs: no use of accessory muscles, no dullness to percussion, decreased without rales or rhonchi  Cardiovascular: Rhythm regular, heart sounds  normal, no murmurs or gallops, no peripheral edema Musculoskeletal: No deformities, no cyanosis or clubbing , no tremors        Assessment & Plan:

## 2020-02-28 NOTE — Assessment & Plan Note (Signed)
Well-controlled. Continue current strategy of using Qvar during spring and fall

## 2020-03-04 ENCOUNTER — Ambulatory Visit: Payer: No Typology Code available for payment source | Admitting: Allergy

## 2020-03-11 ENCOUNTER — Other Ambulatory Visit: Payer: Self-pay | Admitting: Legal Medicine

## 2020-03-11 DIAGNOSIS — E039 Hypothyroidism, unspecified: Secondary | ICD-10-CM

## 2020-03-12 ENCOUNTER — Other Ambulatory Visit: Payer: Self-pay | Admitting: Legal Medicine

## 2020-03-12 DIAGNOSIS — E039 Hypothyroidism, unspecified: Secondary | ICD-10-CM

## 2020-03-27 ENCOUNTER — Other Ambulatory Visit: Payer: Self-pay | Admitting: Physician Assistant

## 2020-03-27 DIAGNOSIS — F419 Anxiety disorder, unspecified: Secondary | ICD-10-CM

## 2020-04-14 ENCOUNTER — Other Ambulatory Visit: Payer: Self-pay | Admitting: Allergy

## 2020-04-15 ENCOUNTER — Other Ambulatory Visit: Payer: Self-pay | Admitting: Legal Medicine

## 2020-04-15 DIAGNOSIS — E785 Hyperlipidemia, unspecified: Secondary | ICD-10-CM

## 2020-04-15 DIAGNOSIS — E786 Lipoprotein deficiency: Secondary | ICD-10-CM

## 2020-07-28 ENCOUNTER — Ambulatory Visit: Payer: No Typology Code available for payment source | Admitting: Legal Medicine

## 2020-07-29 ENCOUNTER — Encounter: Payer: Self-pay | Admitting: Legal Medicine

## 2020-07-29 ENCOUNTER — Other Ambulatory Visit: Payer: Self-pay

## 2020-07-29 ENCOUNTER — Ambulatory Visit: Payer: No Typology Code available for payment source | Admitting: Legal Medicine

## 2020-07-29 VITALS — BP 110/80 | HR 73 | Temp 97.5°F | Resp 16 | Ht 68.0 in | Wt 252.0 lb

## 2020-07-29 DIAGNOSIS — K219 Gastro-esophageal reflux disease without esophagitis: Secondary | ICD-10-CM | POA: Diagnosis not present

## 2020-07-29 DIAGNOSIS — E039 Hypothyroidism, unspecified: Secondary | ICD-10-CM | POA: Diagnosis not present

## 2020-07-29 DIAGNOSIS — G4730 Sleep apnea, unspecified: Secondary | ICD-10-CM

## 2020-07-29 DIAGNOSIS — F419 Anxiety disorder, unspecified: Secondary | ICD-10-CM

## 2020-07-29 DIAGNOSIS — R7303 Prediabetes: Secondary | ICD-10-CM | POA: Diagnosis not present

## 2020-07-29 DIAGNOSIS — J4520 Mild intermittent asthma, uncomplicated: Secondary | ICD-10-CM

## 2020-07-29 DIAGNOSIS — E559 Vitamin D deficiency, unspecified: Secondary | ICD-10-CM

## 2020-07-29 DIAGNOSIS — E782 Mixed hyperlipidemia: Secondary | ICD-10-CM

## 2020-07-29 NOTE — Progress Notes (Signed)
Subjective:  Patient ID: Elijah Small, male    DOB: 1966-05-03  Age: 55 y.o. MRN: [id number]  Chief Complaint  Patient presents with  . Hypothyroidism  . Gastroesophageal Reflux  . Prediabetes    HPI: chronic visit  Prediabetes on diet, lost 5 lbs.  Patient has gastroesophageal reflux symptoms withesophagitis and LTRD.  The symptoms are moderate intensity.  Length of symptoms 10 years.  Medicines include OTC.  Complications include none  Patient presents with hyperlipidemia.  Compliance with treatment has been good; patient takes medicines as directed, maintains low cholesterol diet, follows up as directed, and maintains exercise regimen.  Patient is using crestor without problems..   Current Outpatient Medications on File Prior to Visit  Medication Sig Dispense Refill  . albuterol (VENTOLIN HFA) 108 (90 Base) MCG/ACT inhaler Inhale 2 puffs into the lungs every 6 (six) hours as needed for wheezing or shortness of breath. 18 g 6  . beclomethasone (QVAR REDIHALER) 40 MCG/ACT inhaler Inhale two doses twice daily to prevent cough or wheeze.  Rinse, gargle, and spit after use. 31.8 g 3  . Cholecalciferol (VITAMIN D3) 50 MCG (2000 UT) TABS Take 2 tablets by mouth daily. 30 tablet   . Fluticasone Propionate (XHANCE) 93 MCG/ACT EXHU BLOW 2 DOSES IN EACH NOSTRIL TWICE DAILY 32 mL 5  . rosuvastatin (CRESTOR) 40 MG tablet TAKE 1 TABLET AT BEDTIME 90 tablet 2  . sertraline (ZOLOFT) 50 MG tablet Take 1 tablet (50 mg total) by mouth daily. 90 tablet 2  . SYNTHROID 75 MCG tablet TAKE 1 TABLET DAILY BEFORE BREAKFAST 90 tablet 2   No current facility-administered medications on file prior to visit.   Past Medical History:  Diagnosis Date  . Anxiety   . Diverticulitis   . History of colon polyps   . Mixed hyperlipidemia    Past Surgical History:  Procedure Laterality Date  . COLECTOMY  2208   12 inches - Dr. [facility]  . COLOSTOMY  2009   colectomy   . KNEE SURGERY Bilateral      Family History  Problem Relation Age of Onset  . Hypertension Father   . Heart disease Father        He died at 38 from MI   . Colon polyps Father        age of onset unknown  . Stroke Mother   . Dementia Mother   . Breast cancer Mother   . Cancer Brother        thyroid  . Diabetes Brother   . Hypertension Brother   . Asthma Son   . Allergic rhinitis Son   . Asthma Daughter   . Allergic rhinitis Daughter   . Colon cancer Paternal Uncle        early 80s  . Esophageal cancer Neg Hx   . Rectal cancer Neg Hx   . Stomach cancer Neg Hx    Social History   Socioeconomic History  . Marital status: Married    Spouse name: Not on file  . Number of children: 3  . Years of education: Not on file  . Highest education level: Not on file  Occupational History  . Occupation: Therapist, nutritional  Tobacco Use  . Smoking status: Never Smoker  . Smokeless tobacco: Current User    Types: Snuff  Vaping Use  . Vaping Use: Never used  Substance and Sexual Activity  . Alcohol use: No  . Drug use: No  . Sexual activity:  Yes    Partners: Female  Other Topics Concern  . Not on file  Social History Narrative   Marital Status: Married [place])   Children: Son (1), Daughters (2)    Pets: Dogs (4)     Living Situation: Lives with wife and kids.   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  HENT: Negative for congestion and sinus pressure.   Eyes:  Negative for visual disturbance.  Respiratory: Negative for chest tightness and shortness of breath.   Cardiovascular: Negative for chest pain, palpitations and leg swelling.  Gastrointestinal: Negative for abdominal distention and abdominal pain.  Endocrine: Negative for polyuria.  Genitourinary: Negative for difficulty urinating and dysuria.  Musculoskeletal: Negative for arthralgias and back pain.  Skin: Negative.   Neurological: Negative.   Psychiatric/Behavioral: Negative.      Objective:  BP 110/80   Pulse 73   Temp (!) 97.5 F (36.4 C)   Resp 16   Ht 5\' 8"  (1.727 m)   Wt 252 lb (114.3 kg)   SpO2 96%   BMI 38.32 kg/m   BP/Weight 07/29/2020 02/28/2020 11/06/5282  Systolic BP 132 440 102  Diastolic BP 80 70 72  Wt. (Lbs) 252 257.4 257  BMI 38.32 39.14 39.08    Physical Exam Vitals reviewed.  Constitutional:      Appearance: Normal appearance. He is obese.  HENT:     Head: Normocephalic and atraumatic.     Right Ear: Tympanic membrane, ear canal and external ear normal.     Left Ear: Tympanic membrane, ear canal and external ear normal.     Nose: Nose normal.     Mouth/Throat:     Mouth: Mucous membranes are dry.     Pharynx: Oropharynx is clear.  Eyes:     Extraocular Movements: Extraocular movements intact.     Conjunctiva/sclera: Conjunctivae normal.     Pupils: Pupils are equal, round, and reactive to light.  Cardiovascular:     Rate and Rhythm: Normal rate and regular rhythm.     Pulses: Normal pulses.     Heart sounds: Normal heart sounds. No murmur heard. No gallop.   Pulmonary:     Effort: Pulmonary effort is normal. No respiratory distress.     Breath sounds: Normal breath sounds. No rales.  Abdominal:     General: Abdomen is flat. Bowel sounds are normal. There is no distension.     Palpations: Abdomen is soft.     Tenderness: There is no abdominal tenderness.  Musculoskeletal:     Cervical back: Normal range of motion.  Skin:    General: Skin  is warm and dry.     Capillary Refill: Capillary refill takes less than 2 seconds.  Neurological:     General: No focal deficit present.     Mental Status: He is alert and oriented to person, place,  and time. Mental status is at baseline.  Psychiatric:        Mood and Affect: Mood normal.        Thought Content: Thought content normal.        Lab Results  Component Value Date   WBC 5.4 01/29/2020   HGB 13.7 01/29/2020   HCT 41.1 01/29/2020   PLT 146 (L) 01/29/2020   GLUCOSE 116 (H) 01/29/2020   CHOL 191 01/29/2020   TRIG 75 01/29/2020   HDL 37 (L) 01/29/2020   LDLCALC 140 (H) 01/29/2020   ALT 46 (H) 01/29/2020   AST 28 01/29/2020   NA 139 01/29/2020   K 5.0 01/29/2020   CL 102 01/29/2020   CREATININE 1.07 01/29/2020   BUN 11 01/29/2020   CO2 24 01/29/2020   TSH 4.040 01/29/2020   HGBA1C 6.6 (H) 01/29/2020      Assessment & Plan:   1. Acquired hypothyroidism - TSH Patient is known to have hypothyroidism and is n treatment with levothyroxine 73mcg.  Patient was diagnosed 10 years ago.  Other treatment includes none.  Patient is compliant with medicines and last TSH 6 months ago.  Last TSH was normal.  2. Gastroesophageal reflux disease without esophagitis Plan of care was formulated today.  She is doing well.  A plan of care was formulated using patient exam, tests and other sources to optimize care using evidence based information.  Recommend no smoking, no eating after supper, avoid fatty foods, elevate Head of bed, avoid tight fitting clothing.  Continue on OTC.  3. Mild intermittent asthma without complication This patient has asthma mild and is on albuterol.  Patient is not having a flair.  Chronic medicines include albuterol. Addition new medicines none.  Asthma action plan is in place.   4. Prediabetes - Comprehensive metabolic panel - Hemoglobin A1c - CBC with Differential/Platelet Patient has prediabetes and is on diet  5. Anxiety Patient has some  anxiety but is stable on zoloft 50mg   6. Sleep apnea, unspecified type Using CPAP consistently every night and medically benefiting from its use.  7. Vitamin D deficiency - VITAMIN D 25 Hydroxy (Vit-D Deficiency, Fractures) AN INDIVIDUAL CARE PLAN for vitamin D was established and reinforced today.  The patient's status was assessed using clinical findings on exam, labs, and other diagnostic testing. Patient's success at meeting treatment goals based on disease specific evidence-bassed guidelines and found to be in good control. RECOMMENDATIONS include maintaining present medicines and treatment.  8. Morbid obesity ([place]) An individualize plan was formulated for obesity using patient history and physical exam to encourage weight loss.  An evidence based program was formulated.  Patient is to cut portion size with meals and to plan physical exercise 3 days a week at least 20 minutes.  Weight watchers and other programs are helpful.  Planned amount of weight loss 15 lbs.  9. Mixed hyperlipidemia - Lipid panel AN INDIVIDUAL CARE PLAN for hyperlipidemia/ cholesterol was established and reinforced today.  The patient's status was assessed using clinical findings on exam, lab and other diagnostic tests. The patient's disease status was assessed based on evidence-based guidelines and found to be fair controlled. MEDICATIONS were reviewed. SELF MANAGEMENT GOALS have been discussed and patient's success at attaining the goal of low cholesterol was assessed. RECOMMENDATION given include regular exercise 3 days a week and low cholesterol/low fat diet. CLINICAL SUMMARY including written plan to identify barriers unique to the patient due to social or economic  reasons was discussed.  Orders Placed This Encounter  Procedures  . Comprehensive metabolic panel  . Hemoglobin A1c  . Lipid panel  . CBC with Differential/Platelet  . TSH  . VITAMIN D 25 Hydroxy (Vit-D Deficiency, Fractures)      I  spent 30 minutes dedicated to the care of this patient on the date of this encounter to include face-to-face time with the patient, as well as: chart review  Follow-up: Return in about 6 months (around 01/29/2021) for fasting.  An After Visit Summary was printed and given to the patient.  Reinaldo Meeker, MD [facility] [phone]

## 2020-07-30 LAB — CBC WITH DIFFERENTIAL/PLATELET
Basophils Absolute: 0.1 10*3/uL (ref 0.0–0.2)
Basos: 1 %
EOS (ABSOLUTE): 0.3 10*3/uL (ref 0.0–0.4)
Eos: 6 %
Hematocrit: 43.5 % (ref 37.5–51.0)
Hemoglobin: 14.6 g/dL (ref 13.0–17.7)
Immature Grans (Abs): 0.1 10*3/uL (ref 0.0–0.1)
Immature Granulocytes: 1 %
Lymphocytes Absolute: 1.4 10*3/uL (ref 0.7–3.1)
Lymphs: 25 %
MCH: 28.9 pg (ref 26.6–33.0)
MCHC: 33.6 g/dL (ref 31.5–35.7)
MCV: 86 fL (ref 79–97)
Monocytes Absolute: 0.6 10*3/uL (ref 0.1–0.9)
Monocytes: 10 %
Neutrophils Absolute: 3.2 10*3/uL (ref 1.4–7.0)
Neutrophils: 57 %
Platelets: 164 10*3/uL (ref 150–450)
RBC: 5.06 x10E6/uL (ref 4.14–5.80)
RDW: 12.3 % (ref 11.6–15.4)
WBC: 5.7 10*3/uL (ref 3.4–10.8)

## 2020-07-30 LAB — COMPREHENSIVE METABOLIC PANEL
ALT: 36 IU/L (ref 0–44)
AST: 29 IU/L (ref 0–40)
Albumin/Globulin Ratio: 1.8 (ref 1.2–2.2)
Albumin: 4.4 g/dL (ref 3.8–4.9)
Alkaline Phosphatase: 71 IU/L (ref 44–121)
BUN/Creatinine Ratio: 13 (ref 9–20)
BUN: 13 mg/dL (ref 6–24)
Bilirubin Total: 0.4 mg/dL (ref 0.0–1.2)
CO2: 20 mmol/L (ref 20–29)
Calcium: 9.1 mg/dL (ref 8.7–10.2)
Chloride: 103 mmol/L (ref 96–106)
Creatinine, Ser: 1.02 mg/dL (ref 0.76–1.27)
Globulin, Total: 2.5 g/dL (ref 1.5–4.5)
Glucose: 105 mg/dL — ABNORMAL HIGH (ref 65–99)
Potassium: 5 mmol/L (ref 3.5–5.2)
Sodium: 140 mmol/L (ref 134–144)
Total Protein: 6.9 g/dL (ref 6.0–8.5)
eGFR: 87 mL/min/{1.73_m2} (ref >59)

## 2020-07-30 LAB — LIPID PANEL
Chol/HDL Ratio: 3.3 ratio (ref 0.0–5.0)
Cholesterol, Total: 123 mg/dL (ref 100–199)
HDL: 37 mg/dL — ABNORMAL LOW (ref >39)
LDL Chol Calc (NIH): 74 mg/dL (ref 0–99)
Triglycerides: 55 mg/dL (ref 0–149)
VLDL Cholesterol Cal: 12 mg/dL (ref 5–40)

## 2020-07-30 LAB — TSH: TSH: 3.66 u[IU]/mL (ref 0.450–4.500)

## 2020-07-30 LAB — VITAMIN D 25 HYDROXY (VIT D DEFICIENCY, FRACTURES): Vit D, 25-Hydroxy: 59.8 ng/mL (ref 30.0–100.0)

## 2020-07-30 LAB — HEMOGLOBIN A1C
Est. average glucose Bld gHb Est-mCnc: 146 mg/dL
Hgb A1c MFr Bld: 6.7 % — ABNORMAL HIGH (ref 4.8–5.6)

## 2020-07-30 LAB — CARDIOVASCULAR RISK ASSESSMENT

## 2020-07-30 NOTE — Progress Notes (Signed)
Glucose 105, kidney and liver tests normal, A1c 6.7 OK, Cholesterol OK, CBC normal, TSH 3.66 normal value, Vitamin D 59.8 normal, can stop supplements lp

## 2020-08-13 ENCOUNTER — Telehealth: Payer: Self-pay

## 2020-08-13 NOTE — Telephone Encounter (Signed)
E-mail was received for pt stating that he wants to change providers at our office. I left a message on the pt to call back to reschedule his appointment with a different provider.

## 2020-08-19 ENCOUNTER — Encounter: Payer: Self-pay | Admitting: Nurse Practitioner

## 2020-08-19 ENCOUNTER — Other Ambulatory Visit: Payer: Self-pay | Admitting: Legal Medicine

## 2020-08-19 DIAGNOSIS — I152 Hypertension secondary to endocrine disorders: Secondary | ICD-10-CM

## 2020-08-19 DIAGNOSIS — E669 Obesity, unspecified: Secondary | ICD-10-CM

## 2020-08-19 MED ORDER — METFORMIN HCL 500 MG PO TABS: 500.0000 mg | ORAL_TABLET | Freq: Two times a day (BID) | ORAL | 3 refills | Status: DC

## 2020-09-22 ENCOUNTER — Other Ambulatory Visit: Payer: Self-pay | Admitting: Allergy

## 2020-10-26 ENCOUNTER — Other Ambulatory Visit: Payer: Self-pay | Admitting: Legal Medicine

## 2020-10-26 DIAGNOSIS — F419 Anxiety disorder, unspecified: Secondary | ICD-10-CM

## 2020-11-03 ENCOUNTER — Other Ambulatory Visit: Payer: Self-pay | Admitting: Allergy

## 2020-11-04 ENCOUNTER — Other Ambulatory Visit: Payer: Self-pay | Admitting: Allergy

## 2020-11-19 ENCOUNTER — Other Ambulatory Visit: Payer: Self-pay | Admitting: Legal Medicine

## 2020-11-19 DIAGNOSIS — E039 Hypothyroidism, unspecified: Secondary | ICD-10-CM

## 2020-12-15 ENCOUNTER — Other Ambulatory Visit: Payer: Self-pay

## 2020-12-15 ENCOUNTER — Encounter: Payer: Self-pay | Admitting: Nurse Practitioner

## 2020-12-15 ENCOUNTER — Ambulatory Visit: Payer: No Typology Code available for payment source | Admitting: Nurse Practitioner

## 2020-12-15 VITALS — BP 110/62 | HR 80 | Temp 97.4°F | Ht 68.0 in | Wt 241.0 lb

## 2020-12-15 DIAGNOSIS — H60391 Other infective otitis externa, right ear: Secondary | ICD-10-CM

## 2020-12-15 DIAGNOSIS — H9201 Otalgia, right ear: Secondary | ICD-10-CM | POA: Diagnosis not present

## 2020-12-15 MED ORDER — CIPROFLOXACIN-DEXAMETHASONE 0.3-0.1 % OT SUSP
4.0000 [drp] | Freq: Two times a day (BID) | OTIC | 0 refills | Status: DC
Start: 2020-12-15 — End: 2021-10-20

## 2020-12-15 NOTE — Patient Instructions (Addendum)
Instill Ciprodex drops into right ear twice daily for 7 days Notify office if symptoms fail to improve or worsen in 48 hours Water precautions, no water in right ear Follow up as needed  Otitis Externa  Otitis externa is an infection of the outer ear canal. The outer ear canal is the area between the outside of the ear and the eardrum. Otitis externa issometimes called swimmer's ear. What are the causes? Common causes of this condition include: Swimming in dirty water. Moisture in the ear. An injury to the inside of the ear. An object stuck in the ear. A cut or scrape on the outside of the ear. What increases the risk? You are more likely to get this condition if you go swimming often. What are the signs or symptoms? Itching in the ear. This is often the first symptom. Swelling of the ear. Redness in the ear. Ear pain. The pain may get worse when you pull on your ear. Pus coming from the ear. How is this treated? This condition may be treated with: Antibiotic ear drops. These are often given for 10-14 days. Medicines to reduce itching and swelling. Follow these instructions at home: If you were given antibiotic ear drops, use them as told by your doctor. Do not stop using them even if your condition gets better. Take over-the-counter and prescription medicines only as told by your doctor. Avoid getting water in your ears as told by your doctor. You may be told to avoid swimming or water sports for a few days. Keep all follow-up visits as told by your doctor. This is important. How is this prevented? Keep your ears dry. Use the corner of a towel to dry your ears after you swim or bathe. Try not to scratch or put things in your ear. Doing these things makes it easier for germs to grow in your ear. Avoid swimming in lakes, dirty water, or pools that may not have the right amount of a chemical called chlorine. Contact a doctor if: You have a fever. Your ear is still red, swollen, or  painful after 3 days. You still have pus coming from your ear after 3 days. Your redness, swelling, or pain gets worse. You have a really bad headache. You have redness, swelling, pain, or tenderness behind your ear. Summary Otitis externa is an infection of the outer ear canal. Symptoms include pain, redness, and swelling of the ear. If you were given antibiotic ear drops, use them as told by your doctor. Do not stop using them even if your condition gets better. Try not to scratch or put things in your ear. This information is not intended to replace advice given to you by your health care provider. Make sure you discuss any questions you have with your healthcare provider. Document Revised: 09/29/2017 Document Reviewed: 09/30/2017 Elsevier Patient Education  [place].

## 2020-12-15 NOTE — Progress Notes (Signed)
Acute Office Visit  Subjective:    Patient ID: Elijah Small, male    DOB: Mar 13, 1966, 55 y.o.   MRN: [id number]  Chief Complaint  Patient presents with   Right ear pain    X1 week    HPI Patient is in today for right ear pain and fullness. Onset was one-week-ago. Treatment has included Swimmer's Ear topical drops to right ear with minimal relief. He tells me that he has been swimming in his in-ground chlorine pool. He denies URI symptoms, fever, recent air travel ,or scuba diving. Denies previous tympanic membrane perforation or surgery.   Past Medical History:  Diagnosis Date   Anxiety    Diverticulitis    History of colon polyps    Mixed hyperlipidemia     Past Surgical History:  Procedure Laterality Date   COLECTOMY  2208   12 inches - Dr. [facility]   COLOSTOMY  2009   colectomy    KNEE SURGERY Bilateral     Family History  Problem Relation Age of Onset   Hypertension Father    Heart disease Father        He died at 48 from MI    Colon polyps Father        age of onset unknown   Stroke Mother    Dementia Mother    Breast cancer Mother    Cancer Brother        thyroid   Diabetes Brother    Hypertension Brother    Asthma Son    Allergic rhinitis Son    Asthma Daughter    Allergic rhinitis Daughter    Colon cancer Paternal Uncle        early 29s   Esophageal cancer Neg Hx    Rectal cancer Neg Hx    Stomach cancer Neg Hx     Social History   Socioeconomic History   Marital status: Married    Spouse name: Not on file   Number of children: 3   Years of education: Not on file   Highest education level: Not on file  Occupational History   Occupation: Therapist, nutritional  Tobacco Use   Smoking status: Never   Smokeless tobacco: Current    Types: Snuff  Vaping Use   Vaping Use: Never used  Substance and Sexual Activity   Alcohol use: No   Drug use: No   Sexual activity: Yes    Partners: Female  Other Topics Concern   Not on file   Social History Narrative   Marital Status: Married [place])   Children: Son (1), Daughters (2)    Pets: Dogs (4)     Living Situation: Lives with wife and kids.   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[employer])    Education: [employer]    Tobacco Use/Exposure:  None    Alcohol Use:  None   Drug Use:  None   Diet:  Regular   Exercise:  Limited    Hobbies: Runner, broadcasting/film/video    Social Determinants of Health   Financial Resource Strain: Not on file  Food Insecurity: Not on file  Transportation Needs: Not on file  Physical Activity: Not on file  Stress: Not on file  Social Connections: Not on file  Intimate Partner Violence: Not on file    Outpatient Medications Prior to Visit  Medication Sig Dispense Refill   albuterol (VENTOLIN HFA) 108 (90 Base) MCG/ACT inhaler Inhale 2 puffs  into the lungs every 6 (six) hours as needed for wheezing or shortness of breath. 18 g 6   beclomethasone (QVAR REDIHALER) 40 MCG/ACT inhaler Inhale two doses twice daily to prevent cough or wheeze.  Rinse, gargle, and spit after use. 31.8 g 3   Cholecalciferol (VITAMIN D3) 50 MCG (2000 UT) TABS Take 2 tablets by mouth daily. 30 tablet    metFORMIN (GLUCOPHAGE) 500 MG tablet Take 1 tablet (500 mg total) by mouth 2 (two) times daily with a meal. 180 tablet 3   rosuvastatin (CRESTOR) 40 MG tablet TAKE 1 TABLET AT BEDTIME 90 tablet 2   sertraline (ZOLOFT) 50 MG tablet TAKE 1 TABLET DAILY 90 tablet 2   SYNTHROID 75 MCG tablet TAKE 1 TABLET DAILY BEFORE BREAKFAST 90 tablet 2   XHANCE 93 MCG/ACT EXHU BLOW 2 DOSES IN EACH NOSTRIL TWICE DAILY 16 mL 0   No facility-administered medications prior to visit.    No Known Allergies  Review of Systems  Constitutional:  Negative for appetite change, fatigue and fever.  HENT:  Positive for ear pain (Right ear pain). Negative for congestion and sore throat.   Respiratory:  Negative for cough and shortness of breath.   Cardiovascular:   Negative for chest pain and leg swelling.  Gastrointestinal:  Negative for abdominal pain, constipation, diarrhea, nausea and vomiting.  Genitourinary:  Negative for dysuria and frequency.  Musculoskeletal:  Negative for arthralgias and myalgias.  Neurological:  Negative for dizziness and headaches.  Psychiatric/Behavioral:  Negative for dysphoric mood. The patient is not nervous/anxious.       Objective:    Physical Exam Vitals reviewed.  Constitutional:      Appearance: Normal appearance.  HENT:     Head: Normocephalic.     Right Ear: Drainage, swelling and tenderness present.     Left Ear: Tympanic membrane, ear canal and external ear normal.     Ears:     Comments: Unable to visualize right tympanic membrane due to edema and purulent drainage Pulmonary:     Effort: Pulmonary effort is normal.  Skin:    General: Skin is warm and dry.     Capillary Refill: Capillary refill takes less than 2 seconds.  Neurological:     General: No focal deficit present.     Mental Status: He is alert and oriented to person, place, and time.  Psychiatric:        Mood and Affect: Mood normal.        Behavior: Behavior normal.    BP 110/62 (BP Location: Left Arm, Patient Position: Sitting)   Pulse 80   Temp (!) 97.4 F (36.3 C) (Temporal)   Ht _0  (1.727 m)   Wt 241 lb (109.3 kg)   SpO2 96%   BMI 36.64 kg/m  Wt Readings from Last 3 Encounters:  12/15/20 241 lb (109.3 kg)  07/29/20 252 lb (114.3 kg)  02/28/20 257 lb 6.4 oz (116.8 kg)    Health Maintenance Due  Topic Date Due   Pneumococcal Vaccine 57-29 Years old (1 - PCV) Never done   URINE MICROALBUMIN  Never done   Hepatitis C Screening  Never done   Zoster Vaccines- Shingrix (2 of 2) 07/03/2018   COVID-19 Vaccine (3 - Pfizer risk series) 10/03/2019   INFLUENZA VACCINE  12/08/2020     Lab Results  Component Value Date   TSH 3.660 07/29/2020   Lab Results  Component Value Date   WBC 5.7 07/29/2020   HGB 14.6  07/29/2020   HCT 43.5 07/29/2020   MCV 86 07/29/2020   PLT 164 07/29/2020   Lab Results  Component Value Date   NA 140 07/29/2020   K 5.0 07/29/2020   CO2 20 07/29/2020   GLUCOSE 105 (H) 07/29/2020   BUN 13 07/29/2020   CREATININE 1.02 07/29/2020   BILITOT 0.4 07/29/2020   ALKPHOS 71 07/29/2020   AST 29 07/29/2020   ALT 36 07/29/2020   PROT 6.9 07/29/2020   ALBUMIN 4.4 07/29/2020   CALCIUM 9.1 07/29/2020   EGFR 87 07/29/2020   Lab Results  Component Value Date   CHOL 123 07/29/2020   Lab Results  Component Value Date   HDL 37 (L) 07/29/2020   Lab Results  Component Value Date   LDLCALC 74 07/29/2020   Lab Results  Component Value Date   TRIG 55 07/29/2020   Lab Results  Component Value Date   CHOLHDL 3.3 07/29/2020   Lab Results  Component Value Date   HGBA1C 6.7 (H) 07/29/2020         Assessment & Plan:   1. Infectious otitis externa, right - ciprofloxacin-dexamethasone (CIPRODEX) OTIC suspension; Place 4 drops into the right ear 2 (two) times daily.  Dispense: 7.5 mL; Refill: 0 -ear wick inserted into right ear canal  2. Acute otalgia, right  -Tylenol/Ibuprofen as directed  Instill Ciprodex drops into right ear twice daily for 7 days Notify office if symptoms fail to improve or worsen in 48 hours Water precautions, no water in right ear Follow up as needed   I,Lauren M Auman,acting as a scribe for [employer], NP.,have documented all relevant documentation on the behalf of Rip Harbour, NP,as directed by  Rip Harbour, NP while in the presence of Rip Harbour, NP.   I, Rip Harbour, NP, have reviewed all documentation for this visit. The documentation on 12/15/20 for the exam, diagnosis, procedures, and orders are all accurate and complete.   Follow-up: As needed  SignedJerrell Belfast, DNP 12/15/20 at 3:37 p.m.

## 2020-12-18 ENCOUNTER — Encounter: Payer: Self-pay | Admitting: Nurse Practitioner

## 2021-01-10 ENCOUNTER — Other Ambulatory Visit: Payer: Self-pay | Admitting: Legal Medicine

## 2021-01-10 DIAGNOSIS — E786 Lipoprotein deficiency: Secondary | ICD-10-CM

## 2021-01-10 DIAGNOSIS — E785 Hyperlipidemia, unspecified: Secondary | ICD-10-CM

## 2021-01-29 ENCOUNTER — Ambulatory Visit: Payer: No Typology Code available for payment source | Admitting: Legal Medicine

## 2021-01-29 ENCOUNTER — Ambulatory Visit: Payer: No Typology Code available for payment source | Admitting: Nurse Practitioner

## 2021-02-03 ENCOUNTER — Encounter: Payer: Self-pay | Admitting: Nurse Practitioner

## 2021-02-03 ENCOUNTER — Telehealth (INDEPENDENT_AMBULATORY_CARE_PROVIDER_SITE_OTHER): Payer: No Typology Code available for payment source | Admitting: Nurse Practitioner

## 2021-02-03 DIAGNOSIS — Z20822 Contact with and (suspected) exposure to covid-19: Secondary | ICD-10-CM

## 2021-02-03 MED ORDER — MOLNUPIRAVIR EUA 200MG CAPSULE: 4.0000 | ORAL_CAPSULE | Freq: Two times a day (BID) | ORAL | 0 refills | Status: AC

## 2021-02-03 NOTE — Progress Notes (Signed)
Virtual Visit via Video Note   This visit type was conducted due to national recommendations for restrictions regarding the COVID-19 Pandemic (e.g. social distancing) in an effort to limit this patient's exposure and mitigate transmission in our community.  Due to his co-morbid illnesses, this patient is at least at moderate risk for complications without adequate follow up.  This format is felt to be most appropriate for this patient at this time.  All issues noted in this document were discussed and addressed.  A limited physical exam was performed with this format.  A verbal consent was obtained for the virtual visit.   Date:  02/03/2021   ID:  [name], DOB Dec 16, 1965, MRN [id number]  Patient Location: Home Provider Location: Office/Clinic  PCP:  Rip Harbour, NP   Evaluation Performed:  Established patient, acute telemedicine visit  Chief Complaint:  Sinus congestion  History of Present Illness:    Elijah Small is a 55 y.o. male with sinus congestion, sore throat, and body aches. Onset of symptoms was yesterday. Treatment has included Tylenol. He had close in-home exposure to COVID-19 with his spouse testing positive today. His grandson, who his wife keeps during the week, tested positive for COVID-19 last week. He has taken COVID-19 vaccines.   The patient does not have symptoms concerning for COVID-19 infection (fever, chills, cough, or new shortness of breath).    Past Medical History:  Diagnosis Date   Anxiety    Diverticulitis    History of colon polyps    Mixed hyperlipidemia     Past Surgical History:  Procedure Laterality Date   COLECTOMY  2208   12 inches - Dr. [facility]   COLOSTOMY  2009   colectomy    KNEE SURGERY Bilateral     Family History  Problem Relation Age of Onset   Hypertension Father    Heart disease Father        He died at 39 from MI    Colon polyps Father        age of onset unknown   Stroke Mother     Dementia Mother    Breast cancer Mother    Cancer Brother        thyroid   Diabetes Brother    Hypertension Brother    Asthma Son    Allergic rhinitis Son    Asthma Daughter    Allergic rhinitis Daughter    Colon cancer Paternal Uncle        early 66s   Esophageal cancer Neg Hx    Rectal cancer Neg Hx    Stomach cancer Neg Hx     Social History   Socioeconomic History   Marital status: Married    Spouse name: Not on file   Number of children: 3   Years of education: Not on file   Highest education level: Not on file  Occupational History   Occupation: Therapist, nutritional  Tobacco Use   Smoking status: Never   Smokeless tobacco: Current    Types: Snuff  Vaping Use   Vaping Use: Never used  Substance and Sexual Activity   Alcohol use: No   Drug use: No   Sexual activity: Yes    Partners: Female  Other Topics Concern   Not on file  Social History Narrative   Marital Status: Married [place])   Children: Son (1), Daughters (2)    Pets: Dogs (4)     Living Situation: Lives with wife and  kids.   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[employer])    Education: Programmer, systems    Tobacco Use/Exposure:  None    Alcohol Use:  None   Drug Use:  None   Diet:  Regular   Exercise:  Limited    Hobbies: Runner, broadcasting/film/video    Social Determinants of Health   Financial Resource Strain: Not on file  Food Insecurity: Not on file  Transportation Needs: Not on file  Physical Activity: Not on file  Stress: Not on file  Social Connections: Not on file  Intimate Partner Violence: Not on file    Outpatient Medications Prior to Visit  Medication Sig Dispense Refill   albuterol (VENTOLIN HFA) 108 (90 Base) MCG/ACT inhaler Inhale 2 puffs into the lungs every 6 (six) hours as needed for wheezing or shortness of breath. 18 g 6   beclomethasone (QVAR REDIHALER) 40 MCG/ACT inhaler Inhale two doses twice daily to prevent cough or wheeze.  Rinse, gargle, and spit after  use. 31.8 g 3   Cholecalciferol (VITAMIN D3) 50 MCG (2000 UT) TABS Take 2 tablets by mouth daily. 30 tablet    ciprofloxacin-dexamethasone (CIPRODEX) OTIC suspension Place 4 drops into the right ear 2 (two) times daily. 7.5 mL 0   metFORMIN (GLUCOPHAGE) 500 MG tablet Take 1 tablet (500 mg total) by mouth 2 (two) times daily with a meal. 180 tablet 3   rosuvastatin (CRESTOR) 40 MG tablet TAKE 1 TABLET AT BEDTIME 90 tablet 2   sertraline (ZOLOFT) 50 MG tablet TAKE 1 TABLET DAILY 90 tablet 2   SYNTHROID 75 MCG tablet TAKE 1 TABLET DAILY BEFORE BREAKFAST 90 tablet 2   XHANCE 93 MCG/ACT EXHU BLOW 2 DOSES IN EACH NOSTRIL TWICE DAILY 16 mL 0   No facility-administered medications prior to visit.    Allergies:   Patient has no known allergies.   Social History   Tobacco Use   Smoking status: Never   Smokeless tobacco: Current    Types: Snuff  Vaping Use   Vaping Use: Never used  Substance Use Topics   Alcohol use: No   Drug use: No     Review of Systems  Constitutional:  Positive for malaise/fatigue. Negative for chills and fever.  HENT:  Positive for congestion and sore throat.   Eyes: Negative.   Respiratory: Negative.    Cardiovascular: Negative.   Gastrointestinal: Negative.   Genitourinary: Negative.   Musculoskeletal:  Positive for myalgias (generalized body aches).  Skin:  Negative for rash.  Neurological:  Positive for headaches.  Endo/Heme/Allergies: Negative.   Psychiatric/Behavioral: Negative.      Labs/Other Tests and Data Reviewed:    Recent Labs: 07/29/2020: ALT 36; BUN 13; Creatinine, Ser 1.02; Hemoglobin 14.6; Platelets 164; Potassium 5.0; Sodium 140; TSH 3.660   Recent Lipid Panel Lab Results  Component Value Date/Time   CHOL 123 07/29/2020 11:14 AM   TRIG 55 07/29/2020 11:14 AM   HDL 37 (L) 07/29/2020 11:14 AM   CHOLHDL 3.3 07/29/2020 11:14 AM   CHOLHDL 4.5 06/12/2013 08:31 AM   LDLCALC 74 07/29/2020 11:14 AM    Wt Readings from Last 3 Encounters:   12/15/20 241 lb (109.3 kg)  07/29/20 252 lb (114.3 kg)  02/28/20 257 lb 6.4 oz (116.8 kg)     Objective:    Vital Signs:  There were no vitals taken for this visit.   Physical Exam No physical exam due to telemedicine visit  ASSESSMENT & PLAN:  1. Close exposure to COVID-19 virus - molnupiravir EUA (LAGEVRIO) 200 mg CAPS capsule; Take 4 capsules (800 mg total) by mouth 2 (two) times daily for 5 days.  Dispense: 40 capsule; Refill: 0    Rest and push fluids Symptom management with OTC cold remedies Warm salt water gargles and throat lozenges as needed Take Molnupavir as prescribed Notify office or seek emergency medical care     COVID-19 Education: The signs and symptoms of COVID-19 were discussed with the patient and how to seek care for testing (follow up with PCP or arrange E-visit). The importance of social distancing was discussed today.   I spent 10 minutes dedicated to the care of this patient on the date of this encounter to include face-to-face time with the patient, as well as: EMR and prescription medication management.  Follow Up:  In Person prn  Signed, Rip Harbour, NP  02/03/2021 8:22 AM    [place]

## 2021-03-03 ENCOUNTER — Ambulatory Visit: Payer: No Typology Code available for payment source | Admitting: Pulmonary Disease

## 2021-03-03 ENCOUNTER — Ambulatory Visit: Payer: No Typology Code available for payment source | Admitting: Primary Care

## 2021-05-05 ENCOUNTER — Other Ambulatory Visit: Payer: Self-pay

## 2021-05-05 DIAGNOSIS — F419 Anxiety disorder, unspecified: Secondary | ICD-10-CM

## 2021-05-06 ENCOUNTER — Encounter: Payer: Self-pay | Admitting: Nurse Practitioner

## 2021-05-06 MED ORDER — SERTRALINE HCL 50 MG PO TABS
50.0000 mg | ORAL_TABLET | Freq: Every day | ORAL | 0 refills | Status: DC
Start: 2021-05-06 — End: 2021-07-30

## 2021-05-13 ENCOUNTER — Ambulatory Visit (INDEPENDENT_AMBULATORY_CARE_PROVIDER_SITE_OTHER): Payer: BLUE CROSS/BLUE SHIELD | Admitting: Nurse Practitioner

## 2021-05-13 ENCOUNTER — Encounter: Payer: Self-pay | Admitting: Nurse Practitioner

## 2021-05-13 ENCOUNTER — Ambulatory Visit: Payer: BLUE CROSS/BLUE SHIELD

## 2021-05-13 ENCOUNTER — Other Ambulatory Visit: Payer: Self-pay

## 2021-05-13 VITALS — BP 118/80 | HR 77 | Temp 97.6°F | Ht 68.0 in | Wt 250.0 lb

## 2021-05-13 DIAGNOSIS — Z125 Encounter for screening for malignant neoplasm of prostate: Secondary | ICD-10-CM

## 2021-05-13 DIAGNOSIS — E039 Hypothyroidism, unspecified: Secondary | ICD-10-CM | POA: Diagnosis not present

## 2021-05-13 DIAGNOSIS — L989 Disorder of the skin and subcutaneous tissue, unspecified: Secondary | ICD-10-CM

## 2021-05-13 DIAGNOSIS — Z23 Encounter for immunization: Secondary | ICD-10-CM

## 2021-05-13 DIAGNOSIS — E1165 Type 2 diabetes mellitus with hyperglycemia: Secondary | ICD-10-CM | POA: Diagnosis not present

## 2021-05-13 DIAGNOSIS — E669 Obesity, unspecified: Secondary | ICD-10-CM

## 2021-05-13 DIAGNOSIS — Z85828 Personal history of other malignant neoplasm of skin: Secondary | ICD-10-CM

## 2021-05-13 DIAGNOSIS — J4520 Mild intermittent asthma, uncomplicated: Secondary | ICD-10-CM

## 2021-05-13 DIAGNOSIS — E782 Mixed hyperlipidemia: Secondary | ICD-10-CM | POA: Diagnosis not present

## 2021-05-13 DIAGNOSIS — E118 Type 2 diabetes mellitus with unspecified complications: Secondary | ICD-10-CM | POA: Diagnosis not present

## 2021-05-13 DIAGNOSIS — K219 Gastro-esophageal reflux disease without esophagitis: Secondary | ICD-10-CM

## 2021-05-13 LAB — POCT UA - MICROALBUMIN: Microalbumin Ur, POC: 10 mg/L

## 2021-05-13 MED ORDER — QVAR REDIHALER 40 MCG/ACT IN AERB: INHALATION_SPRAY | RESPIRATORY_TRACT | 3 refills | Status: DC

## 2021-05-13 NOTE — Progress Notes (Signed)
Subjective:  Patient ID: Elijah Small, male    DOB: 1965-08-07  Age: 56 y.o. MRN: [id number]  Chief Complaint  Patient presents with   Diabetes   Hypothyroidism   Hypertension    HPI Elijah Small is a 56 year old Caucasian male that presents for follow-up of type 2 diabetes mellitus, hypertension, and hypothyroidism. He has requested a PSA and flu vaccine today. Elijah Small has a skin lesion that his been present to the left side of his face for several months. He has a past history of SCC skin lesion removed several years ago.   Diabetes Mellitus Type II, Follow-up  Lab Results  Component Value Date   HGBA1C 6.7 (H) 07/29/2020   HGBA1C 6.6 (H) 01/29/2020   HGBA1C 6.3 (H) 08/07/2019   Wt Readings from Last 3 Encounters:  05/13/21 250 lb (113.4 kg)  12/15/20 241 lb (109.3 kg)  07/29/20 252 lb (114.3 kg)   Last seen for diabetes 6 months ago.  Management since then includes Metformin. He reports excellent compliance with treatment. He is not having side effects.  Symptoms: No fatigue No foot ulcerations  No appetite changes No nausea  No paresthesia of the feet  No polydipsia  No polyuria No visual disturbances   No vomiting         Most Recent Eye Exam: Last year Current exercise: none Current diet habits: in general, a "healthy" diet    Pertinent Labs: Lab Results  Component Value Date   CHOL 123 07/29/2020   HDL 37 (L) 07/29/2020   LDLCALC 74 07/29/2020   TRIG 55 07/29/2020   CHOLHDL 3.3 07/29/2020   Lab Results  Component Value Date   NA 140 07/29/2020   K 5.0 07/29/2020   CREATININE 1.02 07/29/2020   EGFR 87 07/29/2020   GFRNONAA 78 01/29/2020   GLUCOSE 105 (H) 07/29/2020      Lipid/Cholesterol, Follow-up  Last lipid panel Other pertinent labs  Lab Results  Component Value Date   CHOL 123 07/29/2020   HDL 37 (L) 07/29/2020   LDLCALC 74 07/29/2020   TRIG 55 07/29/2020   CHOLHDL 3.3 07/29/2020   Lab Results  Component Value Date   ALT 36  07/29/2020   AST 29 07/29/2020   PLT 164 07/29/2020   TSH 3.660 07/29/2020     He was last seen for this 6 months ago.  Management since that visit includes Crestor.  He reports excellent compliance with treatment. He is not having side effects.   Hypothyroidism, follow-up: Elijah Small has a past history of hypothyroidism for several years. Current treatment is Levothyroxine 75 mcg daily. Last TSH 3.66 on 07/29/20. He denies side effects of medication or symptoms of hypo/hyperthyroidism. He is adherent to medication regimen and follow-up appointments.   Current Outpatient Medications on File Prior to Visit  Medication Sig Dispense Refill   albuterol (VENTOLIN HFA) 108 (90 Base) MCG/ACT inhaler Inhale 2 puffs into the lungs every 6 (six) hours as needed for wheezing or shortness of breath. 18 g 6   beclomethasone (QVAR REDIHALER) 40 MCG/ACT inhaler Inhale two doses twice daily to prevent cough or wheeze.  Rinse, gargle, and spit after use. 31.8 g 3   Cholecalciferol (VITAMIN D3) 50 MCG (2000 UT) TABS Take 2 tablets by mouth daily. 30 tablet    ciprofloxacin-dexamethasone (CIPRODEX) OTIC suspension Place 4 drops into the right ear 2 (two) times daily. 7.5 mL 0   metFORMIN (GLUCOPHAGE) 500 MG tablet Take 1 tablet (500 mg total) by mouth 2 (  two) times daily with a meal. 180 tablet 3   rosuvastatin (CRESTOR) 40 MG tablet TAKE 1 TABLET AT BEDTIME 90 tablet 2   sertraline (ZOLOFT) 50 MG tablet Take 1 tablet (50 mg total) by mouth daily. 90 tablet 0   SYNTHROID 75 MCG tablet TAKE 1 TABLET DAILY BEFORE BREAKFAST 90 tablet 2   XHANCE 93 MCG/ACT EXHU BLOW 2 DOSES IN EACH NOSTRIL TWICE DAILY 16 mL 0   No current facility-administered medications on file prior to visit.   Past Medical History:  Diagnosis Date   Anxiety    Diverticulitis    History of colon polyps    Mixed hyperlipidemia    Past Surgical History:  Procedure Laterality Date   COLECTOMY  2208   12 inches - Dr. [facility]    COLOSTOMY  2009   colectomy    KNEE SURGERY Bilateral     Family History  Problem Relation Age of Onset   Hypertension Father    Heart disease Father        He died at 4 from MI    Colon polyps Father        age of onset unknown   Stroke Mother    Dementia Mother    Breast cancer Mother    Cancer Brother        thyroid   Diabetes Brother    Hypertension Brother    Asthma Son    Allergic rhinitis Son    Asthma Daughter    Allergic rhinitis Daughter    Colon cancer Paternal Uncle        early 62s   Esophageal cancer Neg Hx    Rectal cancer Neg Hx    Stomach cancer Neg Hx    Social History   Socioeconomic History   Marital status: Married    Spouse name: Not on file   Number of children: 3   Years of education: Not on file   Highest education level: Not on file  Occupational History   Occupation: Therapist, nutritional  Tobacco Use   Smoking status: Never   Smokeless tobacco: Current    Types: Snuff  Vaping Use   Vaping Use: Never used  Substance and Sexual Activity   Alcohol use: No   Drug use: No   Sexual activity: Yes    Partners: Female  Other Topics Concern   Not on file  Social History Narrative   Marital Status: Married [place])   Children: Son (1), Daughters (2)    Pets: Dogs (4)     Living Situation: Lives with wife and kids.   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  HENT:  Negative for congestion, ear pain and sore throat.   Eyes:  Negative.   Respiratory:  Negative for cough and shortness of breath.   Cardiovascular:  Negative for chest pain and leg  swelling.  Gastrointestinal:  Negative for abdominal pain, constipation, diarrhea, nausea and vomiting.  Endocrine: Negative.   Genitourinary:  Negative for dysuria and urgency.  Musculoskeletal:  Negative for arthralgias and myalgias.  Skin:        Skin lesion left temple area  Allergic/Immunologic: Positive for environmental allergies.  Neurological:  Negative for dizziness and headaches.  Hematological: Negative.   Psychiatric/Behavioral:  Negative for dysphoric mood.     Objective:  Pulse 77    Temp 97.6 F (36.4 C)    Ht 5' 8"  (1.727 m)    Wt 250 lb (113.4 kg)    SpO2 96%    BMI 38.01 kg/m  BP 118/80    Pulse 77    Temp 97.6 F (36.4 C)    Ht 5' 8"  (1.727 m)    Wt 250 lb (113.4 kg)    SpO2 96%    BMI 38.01 kg/m    BP/Weight 05/13/2021 12/15/2020 2/35/3614  Systolic BP - 431 540  Diastolic BP - 62 80  Wt. (Lbs) 250 241 252  BMI 38.01 36.64 38.32    Physical Exam Vitals reviewed.  Constitutional:      Appearance: Normal appearance.  HENT:     Head: Normocephalic.     Right Ear: Tympanic membrane normal.     Left Ear: Tympanic membrane normal.     Nose: Nose normal.     Mouth/Throat:     Mouth: Mucous membranes are moist.  Eyes:     Pupils: Pupils are equal, round, and reactive to light.  Cardiovascular:     Rate and Rhythm: Normal rate and regular rhythm.     Pulses: Normal pulses.     Heart sounds: Normal heart sounds.  Pulmonary:     Effort: Pulmonary effort is normal.     Breath sounds: Normal breath sounds.  Abdominal:     General: Bowel sounds are normal.     Palpations: Abdomen is soft.  Musculoskeletal:        General: Normal range of motion.     Cervical back: Neck supple.  Skin:    General: Skin is warm and dry.     Capillary Refill: Capillary refill takes less than 2 seconds.     Findings: Lesion present.          Comments:  Oval, flesh colored skin lesion approximately 3 mm in diameter to left cheek noted  Neurological:     General: No focal deficit present.     Mental Status: He is alert and oriented to person, place, and time.  Psychiatric:        Mood and Affect: Mood normal.        Behavior: Behavior normal.     Lab Results  Component Value Date   WBC 5.7 07/29/2020   HGB 14.6 07/29/2020   HCT 43.5 07/29/2020   PLT 164 07/29/2020   GLUCOSE 105 (H) 07/29/2020   CHOL 123 07/29/2020   TRIG 55 07/29/2020   HDL 37 (L) 07/29/2020   LDLCALC 74 07/29/2020   ALT 36 07/29/2020   AST 29 07/29/2020   NA 140 07/29/2020   K 5.0 07/29/2020   CL 103 07/29/2020   CREATININE 1.02 07/29/2020   BUN 13 07/29/2020   CO2 20 07/29/2020   TSH 3.660 07/29/2020   HGBA1C 6.7 (H) 07/29/2020      Assessment & Plan:   1. Type 2 diabetes mellitus with hyperglycemia, without long-term current use of insulin (HCC) - CBC with  Differential/Platelet - Comprehensive metabolic panel - Hemoglobin A1c - POCT UA - Microalbumin - Ambulatory referral to diabetic education - Cardiovascular Risk Assessment  2. Acquired hypothyroidism - Comprehensive metabolic panel - TSH  3. Mixed hyperlipidemia - Lipid panel - Cardiovascular Risk Assessment  4. Mild intermittent asthma without complication  5. Gastroesophageal reflux disease without esophagitis - CBC with Differential/Platelet - Comprehensive metabolic panel  6. Skin lesion of face - Ambulatory referral to Dermatology  7. History of SCC (squamous cell carcinoma) of skin - Ambulatory referral to Dermatology  8. Encounter for screening for malignant neoplasm of prostate - PSA  9. Encounter for immunization - Flu Vaccine MDCK QUAD PF  10. Obesity, Class II, BMI 35-39.9 - CBC with Differential/Platelet - Comprehensive metabolic panel - Hemoglobin A1c - Lipid panel   We will call you with lab results and appointment to dermatologist Continue  medications Follow-up in 59-month, will give Shingles vaccine at next appt    Follow-up: 649-month fasting  An After Visit Summary was printed and given to the patient.  I, ShRip HarbourNP, have reviewed all documentation for this visit. The documentation on 05/20/21 for the exam, diagnosis, procedures, and orders are all accurate and complete.   Signed, ShRip HarbourNP [id number] [phone]

## 2021-05-13 NOTE — Patient Instructions (Signed)
We will call you with lab results and appointment to dermatologist Continue medications Follow-up in 79-months, will give Shingles vaccine at next appt   Diabetes Mellitus and Exercise Exercising regularly is important for overall health, especially for people who have diabetes mellitus. Exercising is not only about losing weight. It has many other health benefits, such as increasing muscle strength and bone density and reducing body fat and stress. This leads to improved fitness, flexibility, and endurance, all of which result in better overall health. What are the benefits of exercise if I have diabetes? Exercise has many benefits for people with diabetes. They include: Helping to lower and control blood sugar (glucose). Helping the body to respond better to the hormone insulin by improving insulin sensitivity. Reducing how much insulin the body needs. Lowering the risk for heart disease by: Lowering "bad" cholesterol and triglyceride levels. Increasing "good" cholesterol levels. Lowering blood pressure. Lowering blood glucose levels. What is my activity plan? Your health care provider or certified diabetes educator can help you make a plan for the type and frequency of exercise that works for you. This is called your activity plan. Be sure to: Get at least 150 minutes of medium-intensity or high-intensity exercise each week. Exercises may include brisk walking, biking, or water aerobics. Do stretching and strengthening exercises, such as yoga or weight lifting, at least 2 times a week. Spread out your activity over at least 3 days of the week. Get some form of physical activity each day. Do not go more than 2 days in a row without some kind of physical activity. Avoid being inactive for more than 90 minutes at a time. Take frequent breaks to walk or stretch. Choose exercises or activities that you enjoy. Set realistic goals. Start slowly and gradually increase your exercise intensity over  time. How do I manage my diabetes during exercise? Monitor your blood glucose Check your blood glucose before and after exercising. If your blood glucose is: 240 mg/dL (13.3 mmol/L) or higher before you exercise, check your urine for ketones. These are chemicals created by the liver. If you have ketones in your urine, do not exercise until your blood glucose returns to normal. 100 mg/dL (5.6 mmol/L) or lower, eat a snack containing 15-20 grams of carbohydrate. Check your blood glucose 15 minutes after the snack to make sure that your glucose level is above 100 mg/dL (5.6 mmol/L) before you start your exercise. Know the symptoms of low blood glucose (hypoglycemia) and how to treat it. Your risk for hypoglycemia increases during and after exercise. Follow these tips and your health care provider's instructions Keep a carbohydrate snack that is fast-acting for use before, during, and after exercise to help prevent or treat hypoglycemia. Avoid injecting insulin into areas of the body that are going to be exercised. For example, avoid injecting insulin into: Your arms, when you are about to play tennis. Your legs, when you are about to go jogging. Keep records of your exercise habits. Doing this can help you and your health care provider adjust your diabetes management plan as needed. Write down: Food that you eat before and after you exercise. Blood glucose levels before and after you exercise. The type and amount of exercise you have done. Work with your health care provider when you start a new exercise or activity. He or she may need to: Make sure that the activity is safe for you. Adjust your insulin, other medicines, and food that you eat. Drink plenty of water while you  exercise. This prevents loss of water (dehydration) and problems caused by a lot of heat in the body (heat stroke). Where to find more information American Diabetes Association: www.diabetes.org Summary Exercising regularly  is important for overall health, especially for people who have diabetes mellitus. Exercising has many health benefits. It increases muscle strength and bone density and reduces body fat and stress. It also lowers and controls blood glucose. Your health care provider or certified diabetes educator can help you make an activity plan for the type and frequency of exercise that works for you. Work with your health care provider to make sure any new activity is safe for you. Also work with your health care provider to adjust your insulin, other medicines, and the food you eat. This information is not intended to replace advice given to you by your health care provider. Make sure you discuss any questions you have with your health care provider. Document Revised: 01/22/2019 Document Reviewed: 01/22/2019 Elsevier Patient Education  [place].

## 2021-05-13 NOTE — Progress Notes (Signed)
° °  Covid-19 Vaccination Clinic  Name:  Elijah Small    MRN: [id number] DOB: 13-Mar-1966  05/13/2021  [name] was observed post Covid-19 immunization for 15 minutes without incident. He was provided with Vaccine Information Sheet and instruction to access the V-Safe system.   [name] was instructed to call 911 with any severe reactions post vaccine: Difficulty breathing  Swelling of face and throat  A fast heartbeat  A bad rash all over body  Dizziness and weakness   Immunizations Administered     Name Date Dose VIS Date Route   PFIZER Comrnaty(Gray TOP) Covid-19 Vaccine 05/13/2021 10:23 AM 0.3 mL 01/07/2021 Intramuscular   Manufacturer: [employer]   Lot: [id number]   [place]: [phone]

## 2021-05-14 ENCOUNTER — Other Ambulatory Visit: Payer: Self-pay

## 2021-05-14 DIAGNOSIS — E786 Lipoprotein deficiency: Secondary | ICD-10-CM

## 2021-05-14 DIAGNOSIS — E785 Hyperlipidemia, unspecified: Secondary | ICD-10-CM

## 2021-05-14 LAB — CBC WITH DIFFERENTIAL/PLATELET
Basophils Absolute: 0 10*3/uL (ref 0.0–0.2)
Basos: 1 %
EOS (ABSOLUTE): 0.3 10*3/uL (ref 0.0–0.4)
Eos: 7 %
Hematocrit: 42.6 % (ref 37.5–51.0)
Hemoglobin: 14.4 g/dL (ref 13.0–17.7)
Immature Grans (Abs): 0 10*3/uL (ref 0.0–0.1)
Immature Granulocytes: 1 %
Lymphocytes Absolute: 1.5 10*3/uL (ref 0.7–3.1)
Lymphs: 28 %
MCH: 29.2 pg (ref 26.6–33.0)
MCHC: 33.8 g/dL (ref 31.5–35.7)
MCV: 86 fL (ref 79–97)
Monocytes Absolute: 0.5 10*3/uL (ref 0.1–0.9)
Monocytes: 9 %
Neutrophils Absolute: 2.8 10*3/uL (ref 1.4–7.0)
Neutrophils: 54 %
Platelets: 151 10*3/uL (ref 150–450)
RBC: 4.93 x10E6/uL (ref 4.14–5.80)
RDW: 12.3 % (ref 11.6–15.4)
WBC: 5.2 10*3/uL (ref 3.4–10.8)

## 2021-05-14 LAB — LIPID PANEL
Chol/HDL Ratio: 3.3 ratio (ref 0.0–5.0)
Cholesterol, Total: 121 mg/dL (ref 100–199)
HDL: 37 mg/dL — ABNORMAL LOW (ref >39)
LDL Chol Calc (NIH): 72 mg/dL (ref 0–99)
Triglycerides: 56 mg/dL (ref 0–149)
VLDL Cholesterol Cal: 12 mg/dL (ref 5–40)

## 2021-05-14 LAB — COMPREHENSIVE METABOLIC PANEL
ALT: 32 IU/L (ref 0–44)
AST: 23 IU/L (ref 0–40)
Albumin/Globulin Ratio: 1.9 (ref 1.2–2.2)
Albumin: 4.4 g/dL (ref 3.8–4.9)
Alkaline Phosphatase: 60 IU/L (ref 44–121)
BUN/Creatinine Ratio: 14 (ref 9–20)
BUN: 14 mg/dL (ref 6–24)
Bilirubin Total: 0.4 mg/dL (ref 0.0–1.2)
CO2: 23 mmol/L (ref 20–29)
Calcium: 9.2 mg/dL (ref 8.7–10.2)
Chloride: 107 mmol/L — ABNORMAL HIGH (ref 96–106)
Creatinine, Ser: 1.01 mg/dL (ref 0.76–1.27)
Globulin, Total: 2.3 g/dL (ref 1.5–4.5)
Glucose: 128 mg/dL — ABNORMAL HIGH (ref 70–99)
Potassium: 4.9 mmol/L (ref 3.5–5.2)
Sodium: 142 mmol/L (ref 134–144)
Total Protein: 6.7 g/dL (ref 6.0–8.5)
eGFR: 88 mL/min/{1.73_m2} (ref >59)

## 2021-05-14 LAB — CARDIOVASCULAR RISK ASSESSMENT

## 2021-05-14 LAB — HEMOGLOBIN A1C
Est. average glucose Bld gHb Est-mCnc: 137 mg/dL
Hgb A1c MFr Bld: 6.4 % — ABNORMAL HIGH (ref 4.8–5.6)

## 2021-05-14 LAB — PSA: Prostate Specific Ag, Serum: 1 ng/mL (ref 0.0–4.0)

## 2021-05-14 MED ORDER — ROSUVASTATIN CALCIUM 40 MG PO TABS: 40.0000 mg | ORAL_TABLET | Freq: Every day | ORAL | 2 refills | Status: DC

## 2021-05-15 LAB — TSH: TSH: 2.92 u[IU]/mL (ref 0.450–4.500)

## 2021-05-15 LAB — SPECIMEN STATUS REPORT

## 2021-05-18 ENCOUNTER — Other Ambulatory Visit: Payer: Self-pay

## 2021-05-18 DIAGNOSIS — E786 Lipoprotein deficiency: Secondary | ICD-10-CM

## 2021-05-18 DIAGNOSIS — J4520 Mild intermittent asthma, uncomplicated: Secondary | ICD-10-CM

## 2021-05-18 DIAGNOSIS — E785 Hyperlipidemia, unspecified: Secondary | ICD-10-CM

## 2021-05-18 MED ORDER — ROSUVASTATIN CALCIUM 40 MG PO TABS: 40.0000 mg | ORAL_TABLET | Freq: Every day | ORAL | 2 refills | Status: DC

## 2021-05-18 MED ORDER — QVAR REDIHALER 40 MCG/ACT IN AERB: INHALATION_SPRAY | RESPIRATORY_TRACT | 3 refills | Status: DC

## 2021-05-18 NOTE — Telephone Encounter (Signed)
Needing change in pharmacy due to insurance.   Royce Macadamia, [place] 05/18/21 9:20 AM

## 2021-05-20 ENCOUNTER — Encounter: Payer: Self-pay | Admitting: Nurse Practitioner

## 2021-05-20 ENCOUNTER — Other Ambulatory Visit: Payer: Self-pay

## 2021-05-20 DIAGNOSIS — E039 Hypothyroidism, unspecified: Secondary | ICD-10-CM

## 2021-05-20 MED ORDER — ALBUTEROL SULFATE HFA 108 (90 BASE) MCG/ACT IN AERS: 2.0000 | INHALATION_SPRAY | Freq: Four times a day (QID) | RESPIRATORY_TRACT | 6 refills | Status: DC | PRN

## 2021-05-20 MED ORDER — LEVOTHYROXINE SODIUM 75 MCG PO TABS: 75.0000 ug | ORAL_TABLET | Freq: Every day | ORAL | 2 refills | Status: DC

## 2021-05-21 ENCOUNTER — Other Ambulatory Visit: Payer: Self-pay

## 2021-05-21 ENCOUNTER — Encounter: Payer: Self-pay | Admitting: Physician Assistant

## 2021-05-21 ENCOUNTER — Ambulatory Visit (INDEPENDENT_AMBULATORY_CARE_PROVIDER_SITE_OTHER): Payer: BLUE CROSS/BLUE SHIELD | Admitting: Physician Assistant

## 2021-05-21 DIAGNOSIS — D225 Melanocytic nevi of trunk: Secondary | ICD-10-CM

## 2021-05-21 DIAGNOSIS — Z85828 Personal history of other malignant neoplasm of skin: Secondary | ICD-10-CM

## 2021-05-21 DIAGNOSIS — L821 Other seborrheic keratosis: Secondary | ICD-10-CM | POA: Diagnosis not present

## 2021-05-21 DIAGNOSIS — D485 Neoplasm of uncertain behavior of skin: Secondary | ICD-10-CM

## 2021-05-21 NOTE — Progress Notes (Signed)
° °  New Patient   Subjective  Elijah Small is a 56 y.o. male who presents for the following: Skin Problem (Left sideburn- x months- needs checked per PCP. No family h/o melanoma. Personal history of non mole skin cancer but no melanoma. ).   The following portions of the chart were reviewed this encounter and updated as appropriate:  Tobacco   Allergies   Meds   Problems   Med Hx   Surg Hx   Fam Hx       Objective  Well appearing patient in no apparent distress; mood and affect are within normal limits.  All skin waist up examined.  Right Lower Back Bichromic dark nested macule.            Left Temporal Scalp Brown plaque.   Assessment & Plan  Neoplasm of uncertain behavior of skin Right Lower Back  Skin / nail biopsy Type of biopsy: tangential   Informed consent: discussed and consent obtained   Timeout: patient name, date of birth, surgical site, and procedure verified   Anesthesia: the lesion was anesthetized in a standard fashion   Anesthetic:  1% lidocaine w/ epinephrine 1-100,000 local infiltration Instrument used: flexible razor blade   Hemostasis achieved with: aluminum chloride and electrodesiccation   Outcome: patient tolerated procedure well   Post-procedure details: wound care instructions given    Specimen 1 - Surgical pathology Differential Diagnosis: R/O ATYPIA  Check Margins: YES  Seborrheic keratosis Left Temporal Scalp  Ok to leave if stable. Benign genetic growth.     I, Pike Scantlebury, PA-C, have reviewed all documentation's for this visit.  The documentation on 05/21/21 for the exam, diagnosis, procedures and orders are all accurate and complete.

## 2021-05-21 NOTE — Patient Instructions (Signed)

## 2021-05-24 LAB — HM DIABETES EYE EXAM

## 2021-05-27 ENCOUNTER — Telehealth: Payer: Self-pay | Admitting: *Deleted

## 2021-05-27 NOTE — Telephone Encounter (Signed)
-----   Message from Warren Danes, [place] sent at 05/26/2021  4:50 PM EST ----- RTC IF RECURS

## 2021-05-27 NOTE — Telephone Encounter (Signed)
Pathology to patient.  °

## 2021-06-24 ENCOUNTER — Ambulatory Visit: Payer: BLUE CROSS/BLUE SHIELD

## 2021-07-30 ENCOUNTER — Other Ambulatory Visit: Payer: Self-pay | Admitting: Family Medicine

## 2021-07-30 DIAGNOSIS — F419 Anxiety disorder, unspecified: Secondary | ICD-10-CM

## 2021-07-31 ENCOUNTER — Encounter: Payer: Self-pay | Admitting: Nurse Practitioner

## 2021-07-31 MED ORDER — SERTRALINE HCL 50 MG PO TABS: 50.0000 mg | ORAL_TABLET | Freq: Every day | ORAL | 0 refills | Status: DC

## 2021-08-05 ENCOUNTER — Other Ambulatory Visit: Payer: Self-pay

## 2021-08-05 DIAGNOSIS — F419 Anxiety disorder, unspecified: Secondary | ICD-10-CM

## 2021-08-05 MED ORDER — SERTRALINE HCL 50 MG PO TABS: 50.0000 mg | ORAL_TABLET | Freq: Every day | ORAL | 0 refills | Status: DC

## 2021-08-05 NOTE — Telephone Encounter (Signed)
Change in pharmacy.  ? ?Harrell Lark 08/05/21 10:07 AM ? ?

## 2021-09-06 ENCOUNTER — Encounter: Payer: Self-pay | Admitting: Nurse Practitioner

## 2021-09-07 ENCOUNTER — Other Ambulatory Visit: Payer: Self-pay

## 2021-09-07 DIAGNOSIS — I152 Hypertension secondary to endocrine disorders: Secondary | ICD-10-CM

## 2021-09-07 MED ORDER — METFORMIN HCL 500 MG PO TABS: 500.0000 mg | ORAL_TABLET | Freq: Two times a day (BID) | ORAL | 3 refills | Status: DC

## 2021-10-20 ENCOUNTER — Encounter: Payer: Self-pay | Admitting: Allergy

## 2021-10-20 ENCOUNTER — Ambulatory Visit: Payer: BC Managed Care – PPO | Admitting: Allergy

## 2021-10-20 VITALS — BP 124/78 | HR 74 | Resp 16 | Ht 69.0 in | Wt 243.6 lb

## 2021-10-20 DIAGNOSIS — J4540 Moderate persistent asthma, uncomplicated: Secondary | ICD-10-CM

## 2021-10-20 DIAGNOSIS — J3089 Other allergic rhinitis: Secondary | ICD-10-CM | POA: Diagnosis not present

## 2021-10-20 DIAGNOSIS — J45998 Other asthma: Secondary | ICD-10-CM | POA: Diagnosis not present

## 2021-10-20 MED ORDER — XHANCE 93 MCG/ACT NA EXHU: INHALANT_SUSPENSION | NASAL | 5 refills | Status: DC

## 2021-10-20 MED ORDER — ALBUTEROL SULFATE HFA 108 (90 BASE) MCG/ACT IN AERS: 2.0000 | INHALATION_SPRAY | Freq: Four times a day (QID) | RESPIRATORY_TRACT | 1 refills | Status: DC | PRN

## 2021-10-20 MED ORDER — AZELASTINE HCL 0.1 % NA SOLN: 2.0000 | Freq: Two times a day (BID) | NASAL | 5 refills | Status: DC

## 2021-10-20 MED ORDER — BUDESONIDE-FORMOTEROL FUMARATE 160-4.5 MCG/ACT IN AERO: 2.0000 | INHALATION_SPRAY | Freq: Two times a day (BID) | RESPIRATORY_TRACT | 5 refills | Status: DC

## 2021-10-20 NOTE — Patient Instructions (Addendum)
Allergic rhinits Start Astelin 2 sprays each nostril twice a day as needed for nasal drainage/throat clearing.  This is a nasal antihistamine spray.  It is available over-the-counter as Astepro (we will send in the prescription to see if it might be covered with your insurance.) Use  XHANCE 2 sprays each nostril twice a day as needed to help with nasal congestion and pressure. Try Xyzal 5 mg daily as needed.  This is a long-acting antihistamine.  Samples provided. Continue avoidance measures for dust mites and and pets Discussed allergen immunotherapy as a long-term option for symptom control.  Informational handout provided. Will need to get breathing under control before considering immunotherapy.   Mild intermittent asthma Start Symbicort 160 mcg 2 puffs twice a day with spacer device.  This replaces Qvar for now. May use albuterol 2 puffs every 4-6 hours as needed for wheezing, tightness in chest, or shortness of breath.  Also may use albuterol 5 to 15 minutes prior to exercise or strenuous activity.  Asthma control goals:  Full participation in all desired activities (may need albuterol before activity) Albuterol use two time or less a week on average (not counting use with activity) Cough interfering with sleep two time or less a month Oral steroids no more than once a year No hospitalizations   Schedule follow-up in 3 months

## 2021-10-20 NOTE — Progress Notes (Signed)
Follow-up Note  RE: Elijah Small MRN: [id number] DOB: 07-Dec-1965 Date of Office Visit: 10/20/2021   History of present illness: Elijah Small is a 56 y.o. male presenting today for follow-up of asthma and allergic rhinitis.  He was last seen in the office on 10/16/2019 by our nurse practitioner Elijah Small and myself.  He states he has been having a lot of cough and wheezing as well as shortness of breath. He is also doing a lot of throat clearing as well.  He does feel like these symptoms have been worse lately.  Symptoms are worse inside and he does have pets.  He states when he has been outdoors all day he does fine until he comes back inside.  He has a different insurance now and it no longer covers Qvar and has been out of for the past 4 months or so but states with the Qvar use it was causing some voice changes.  He has been using albuterol multiple times a day now for past month or so with relief of symptoms.  He has not required any ED or urgent care visits and has not had any systemic steroids. He does have Xhance and does continue to use this when he has nasal congestion.  He did start using over-the-counter fexofenadine but has not really seen any improvement in symptoms.  Review of systems: Review of Systems  Constitutional: Negative.   HENT:         See HPI  Eyes: Negative.   Respiratory:         See HPI  Cardiovascular: Negative.   Musculoskeletal: Negative.   Skin: Negative.   Allergic/Immunologic: Negative.   Neurological: Negative.      All other systems negative unless noted above in HPI  Past medical/social/surgical/family history have been reviewed and are unchanged unless specifically indicated below.  No changes  Medication List: Current Outpatient Medications  Medication Sig Dispense Refill   azelastine (ASTELIN) 0.1 % nasal spray Place 2 sprays into both nostrils 2 (two) times daily. 30 mL 5   budesonide-formoterol (SYMBICORT) 160-4.5 MCG/ACT  inhaler Inhale 2 puffs into the lungs 2 (two) times daily. 1 each 5   levothyroxine (SYNTHROID) 75 MCG tablet Take 1 tablet (75 mcg total) by mouth daily before breakfast. 90 tablet 2   metFORMIN (GLUCOPHAGE) 500 MG tablet Take 1 tablet (500 mg total) by mouth 2 (two) times daily with a meal. 180 tablet 3   rosuvastatin (CRESTOR) 40 MG tablet Take 1 tablet (40 mg total) by mouth at bedtime. 90 tablet 2   sertraline (ZOLOFT) 50 MG tablet Take 1 tablet (50 mg total) by mouth daily. 90 tablet 0   albuterol (VENTOLIN HFA) 108 (90 Base) MCG/ACT inhaler Inhale 2 puffs into the lungs every 6 (six) hours as needed for wheezing or shortness of breath. 18 g 1   Fluticasone Propionate (XHANCE) 93 MCG/ACT EXHU 2 sprays each nostril twice a day as needed 32 mL 5   No current facility-administered medications for this visit.     Known medication allergies: No Known Allergies   Physical examination: Blood pressure 124/78, pulse 74, resp. rate 16, height '5\' 9"'$  (1.753 m), weight 243 lb 9.6 oz (110.5 kg), SpO2 96 %.  General: Alert, interactive, in no acute distress. HEENT: PERRLA, TMs pearly gray, turbinates mildly edematous without discharge, post-pharynx non erythematous. Neck: Supple without lymphadenopathy. Lungs: Mildly decreased breath sounds with expiratory wheezing bilaterally of lower bases. {no increased work of breathing. CV:  Normal S1, S2 without murmurs. Abdomen: Nondistended, nontender. Skin: Warm and dry, without lesions or rashes. Extremities:  No clubbing, cyanosis or edema. Neuro:   Grossly intact.  Diagnositics/Labs:  Spirometry: FEV1: 2.53L 70%, FVC: 2.81L 61% predicted.  Coughing throughout spirometry attempts.   S/p albuterol he had a 13% increase in FEV1 to 2.85 L or 79% which essentially normalized and is significant  Assessment and plan:   Allergic rhinits Start Astelin 2 sprays each nostril twice a day as needed for nasal drainage/throat clearing.  This is a nasal  antihistamine spray.  It is available over-the-counter as Astepro (we will send in the prescription to see if it might be covered with your insurance.) Use  XHANCE 2 sprays each nostril twice a day as needed to help with nasal congestion and pressure. Try Xyzal 5 mg daily as needed.  This is a long-acting antihistamine.  Samples provided. Continue avoidance measures for dust mites and and pets Discussed allergen immunotherapy as a long-term option for symptom control.  Informational handout provided. Will need to get breathing under control before considering immunotherapy.   Mild intermittent asthma Start Symbicort 160 mcg 2 puffs twice a day with spacer device.  This replaces Qvar for now. May use albuterol 2 puffs every 4-6 hours as needed for wheezing, tightness in chest, or shortness of breath.  Also may use albuterol 5 to 15 minutes prior to exercise or strenuous activity.  Asthma control goals:  Full participation in all desired activities (may need albuterol before activity) Albuterol use two time or less a week on average (not counting use with activity) Cough interfering with sleep two time or less a month Oral steroids no more than once a year No hospitalizations   Schedule follow-up in 3 months  I appreciate the opportunity to take part in Elijah Small's care. Please do not hesitate to contact me with questions.  Sincerely,   Prudy Feeler, MD Allergy/Immunology [facility]

## 2021-10-23 ENCOUNTER — Telehealth: Payer: Self-pay

## 2021-10-23 NOTE — Telephone Encounter (Signed)
Truett Perna PA has been completed  Key: [id number]

## 2021-10-27 NOTE — Telephone Encounter (Signed)
Received fax that [place] PA was denied.  Please advise.

## 2021-10-29 NOTE — Telephone Encounter (Signed)
Talked with patient.  [facility] actually applied some kind of coupon and were able to fill [place] for him.  Coupon lasts for 6 months per patient.

## 2021-10-31 ENCOUNTER — Other Ambulatory Visit: Payer: Self-pay | Admitting: Nurse Practitioner

## 2021-10-31 DIAGNOSIS — F419 Anxiety disorder, unspecified: Secondary | ICD-10-CM

## 2021-11-09 NOTE — Progress Notes (Unsigned)
Subjective:  Patient ID: Elijah Small, male    DOB: 04-19-1966  Age: 56 y.o. MRN: [id number]  No chief complaint on file.   Diabetes Pertinent negatives for hypoglycemia include no dizziness or headaches. Pertinent negatives for diabetes include no chest pain and no fatigue.  Hyperlipidemia Pertinent negatives include no chest pain, myalgias or shortness of breath.     Elijah Small is a 56 year old Caucasian male that presents for follow-up of type 2 diabetes mellitus, hypertension, and hypothyroidism. He has requested a PSA and flu vaccine today. Elijah Small has a skin lesion that his been present to the left side of his face for several months. He has a past history of SCC skin lesion removed several years ago.   We will call you with lab results and appointment to dermatologist Continue medications Follow-up in 73-month, will give Shingles vaccine at next appt  He was last seen for hypertension 6 months ago.  BP at that visit was 118/80. Management includes diet and exercise.  He is following a {diet:21022986} diet. He {is/is not:9024} exercising. He {does/does not:200015} smoke.  Pertinent labs: Lab Results  Component Value Date   CHOL 121 05/13/2021   HDL 37 (L) 05/13/2021   LDLCALC 72 05/13/2021   TRIG 56 05/13/2021   CHOLHDL 3.3 05/13/2021   Lab Results  Component Value Date   NA 142 05/13/2021   K 4.9 05/13/2021   CREATININE 1.01 05/13/2021   EGFR 88 05/13/2021   GFRNONAA 78 01/29/2020   GLUCOSE 128 (H) 05/13/2021     Lipid/Cholesterol, Follow-up  Last lipid panel Other pertinent labs  Lab Results  Component Value Date   CHOL 121 05/13/2021   HDL 37 (L) 05/13/2021   LDLCALC 72 05/13/2021   TRIG 56 05/13/2021   CHOLHDL 3.3 05/13/2021   Lab Results  Component Value Date   ALT 32 05/13/2021   AST 23 05/13/2021   PLT 151 05/13/2021   TSH 2.920 05/13/2021     Management includes crestor.  He reports excellent compliance with treatment. He is not  having side effects.   Current diet: {diet habits:16563} Current exercise: {exercise types:16438}     Hypothyroidism, follow-up: HJadehas a past history of hypothyroidism for several years. Current treatment is Levothyroxine 75 mcg daily. Last TSH 2.920 on 05/13/2021. He denies side effects of medication or symptoms of hypo/hyperthyroidism. He is adherent to medication regimen and follow-up appointments.   Current Outpatient Medications on File Prior to Visit  Medication Sig Dispense Refill   albuterol (VENTOLIN HFA) 108 (90 Base) MCG/ACT inhaler Inhale 2 puffs into the lungs every 6 (six) hours as needed for wheezing or shortness of breath. 18 g 1   azelastine (ASTELIN) 0.1 % nasal spray Place 2 sprays into both nostrils 2 (two) times daily. 30 mL 5   budesonide-formoterol (SYMBICORT) 160-4.5 MCG/ACT inhaler Inhale 2 puffs into the lungs 2 (two) times daily. 1 each 5   Fluticasone Propionate (XHANCE) 93 MCG/ACT EXHU 2 sprays each nostril twice a day as needed 32 mL 5   levothyroxine (SYNTHROID) 75 MCG tablet Take 1 tablet (75 mcg total) by mouth daily before breakfast. 90 tablet 2   metFORMIN (GLUCOPHAGE) 500 MG tablet Take 1 tablet (500 mg total) by mouth 2 (two) times daily with a meal. 180 tablet 3   rosuvastatin (CRESTOR) 40 MG tablet Take 1 tablet (40 mg total) by mouth at bedtime. 90 tablet 2   sertraline (ZOLOFT) 50 MG tablet TAKE 1 TABLET(50 MG) BY MOUTH DAILY  90 tablet 0   No current facility-administered medications on file prior to visit.   Past Medical History:  Diagnosis Date   Anxiety    Asthma    Diverticulitis    History of colon polyps    Mixed hyperlipidemia    Past Surgical History:  Procedure Laterality Date   COLECTOMY  2208   12 inches - Dr. [facility]   COLOSTOMY  2009   colectomy    KNEE SURGERY Bilateral     Family History  Problem Relation Age of Onset   Hypertension Father    Heart disease Father        He died at 6 from MI    Colon  polyps Father        age of onset unknown   Stroke Mother    Dementia Mother    Breast cancer Mother    Cancer Brother        thyroid   Diabetes Brother    Hypertension Brother    Asthma Son    Allergic rhinitis Son    Asthma Daughter    Allergic rhinitis Daughter    Colon cancer Paternal Uncle        early 84s   Esophageal cancer Neg Hx    Rectal cancer Neg Hx    Stomach cancer Neg Hx    Social History   Socioeconomic History   Marital status: Married    Spouse name: Not on file   Number of children: 3   Years of education: Not on file   Highest education level: Not on file  Occupational History   Occupation: Therapist, nutritional  Tobacco Use   Smoking status: Never   Smokeless tobacco: Current    Types: Snuff  Vaping Use   Vaping Use: Never used  Substance and Sexual Activity   Alcohol use: No   Drug use: No   Sexual activity: Yes    Partners: Female  Other Topics Concern   Not on file  Social History Narrative   Marital Status: Married [place])   Children: Son (1), Daughters (2)    Pets: Dogs (4)     Living Situation: Lives with wife and kids.   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  HENT:  Negative for congestion, ear pain and sore throat.   Respiratory:  Negative for cough and shortness of breath.   Cardiovascular:  Negative for chest pain and leg swelling.  Gastrointestinal:  Negative for abdominal pain, constipation, diarrhea, nausea and  vomiting.  Genitourinary:  Negative for dysuria and urgency.  Musculoskeletal:  Negative for arthralgias and myalgias.  Neurological:  Negative for dizziness and headaches.  Psychiatric/Behavioral:  Negative for dysphoric mood.      Objective:  There were no vitals taken for this visit.     10/20/2021   10:19 AM 05/13/2021  9:50 AM 12/15/2020    2:26 PM  BP/Weight  Systolic BP 144 818 563  Diastolic BP 78 80 62  Wt. (Lbs) 243.6 250 241  BMI 35.97 kg/m2 38.01 kg/m2 36.64 kg/m2    Physical Exam  Diabetic Foot Exam - Simple   No data filed      Lab Results  Component Value Date   WBC 5.2 05/13/2021   HGB 14.4 05/13/2021   HCT 42.6 05/13/2021   PLT 151 05/13/2021   GLUCOSE 128 (H) 05/13/2021   CHOL 121 05/13/2021   TRIG 56 05/13/2021   HDL 37 (L) 05/13/2021   LDLCALC 72 05/13/2021   ALT 32 05/13/2021   AST 23 05/13/2021   NA 142 05/13/2021   K 4.9 05/13/2021   CL 107 (H) 05/13/2021   CREATININE 1.01 05/13/2021   BUN 14 05/13/2021   CO2 23 05/13/2021   TSH 2.920 05/13/2021   HGBA1C 6.4 (H) 05/13/2021   MICROALBUR 10 05/13/2021      Assessment & Plan:   Problem List Items Addressed This Visit   None .  No orders of the defined types were placed in this encounter.   No orders of the defined types were placed in this encounter.    Follow-up: No follow-ups on file.  An After Visit Summary was printed and given to the patient.  Rip Harbour, NP [place] [phone]

## 2021-11-11 ENCOUNTER — Ambulatory Visit: Payer: BC Managed Care – PPO | Admitting: Nurse Practitioner

## 2021-11-11 ENCOUNTER — Encounter: Payer: Self-pay | Admitting: Nurse Practitioner

## 2021-11-11 VITALS — BP 122/78 | HR 75 | Temp 97.2°F | Ht 69.0 in | Wt 244.6 lb

## 2021-11-11 DIAGNOSIS — E1165 Type 2 diabetes mellitus with hyperglycemia: Secondary | ICD-10-CM | POA: Diagnosis not present

## 2021-11-11 DIAGNOSIS — Z23 Encounter for immunization: Secondary | ICD-10-CM

## 2021-11-11 DIAGNOSIS — E039 Hypothyroidism, unspecified: Secondary | ICD-10-CM | POA: Diagnosis not present

## 2021-11-11 DIAGNOSIS — F419 Anxiety disorder, unspecified: Secondary | ICD-10-CM | POA: Diagnosis not present

## 2021-11-11 DIAGNOSIS — E782 Mixed hyperlipidemia: Secondary | ICD-10-CM | POA: Diagnosis not present

## 2021-11-11 DIAGNOSIS — Z2821 Immunization not carried out because of patient refusal: Secondary | ICD-10-CM

## 2021-11-11 DIAGNOSIS — Z85828 Personal history of other malignant neoplasm of skin: Secondary | ICD-10-CM

## 2021-11-11 DIAGNOSIS — K219 Gastro-esophageal reflux disease without esophagitis: Secondary | ICD-10-CM | POA: Diagnosis not present

## 2021-11-11 NOTE — Patient Instructions (Addendum)
We will call you with lab results Continue medications Shingles vaccine given in office today Follow-up in 70-month, fasting   Prediabetes Eating Plan Prediabetes is a condition that causes blood sugar (glucose) levels to be higher than normal. This increases the risk for developing type 2 diabetes (type 2 diabetes mellitus). Working with a health care provider or nutrition specialist (dietitian) to make diet and lifestyle changes can help prevent the onset of diabetes. These changes may help you: Control your blood glucose levels. Improve your cholesterol levels. Manage your blood pressure. What are tips for following this plan? Reading food labels Read food labels to check the amount of fat, salt (sodium), and sugar in prepackaged foods. Avoid foods that have: Saturated fats. Trans fats. Added sugars. Avoid foods that have more than 300 milligrams (mg) of sodium per serving. Limit your sodium intake to less than 2,300 mg each day. Shopping Avoid buying pre-made and processed foods. Avoid buying drinks with added sugar. Cooking Cook with olive oil. Do not use butter, lard, or ghee. Bake, broil, grill, steam, or boil foods. Avoid frying. Meal planning  Work with your dietitian to create an eating plan that is right for you. This may include tracking how many calories you take in each day. Use a food diary, notebook, or mobile application to track what you eat at each meal. Consider following a Mediterranean diet. This includes: Eating several servings of fresh fruits and vegetables each day. Eating fish at least twice a week. Eating one serving each day of whole grains, beans, nuts, and seeds. Using olive oil instead of other fats. Limiting alcohol. Limiting red meat. Using nonfat or low-fat dairy products. Consider following a plant-based diet. This includes dietary choices that focus on eating mostly vegetables and fruit, grains, beans, nuts, and seeds. If you have high blood  pressure, you may need to limit your sodium intake or follow a diet such as the DASH (Dietary Approaches to Stop Hypertension) eating plan. The DASH diet aims to lower high blood pressure. Lifestyle Set weight loss goals with help from your health care team. It is recommended that most people with prediabetes lose 7% of their body weight. Exercise for at least 30 minutes 5 or more days a week. Attend a support group or seek support from a mental health counselor. Take over-the-counter and prescription medicines only as told by your health care provider. What foods are recommended? Fruits Berries. Bananas. Apples. Oranges. Grapes. Papaya. Mango. Pomegranate. Kiwi. Grapefruit. Cherries. Vegetables Lettuce. Spinach. Peas. Beets. Cauliflower. Cabbage. Broccoli. Carrots. Tomatoes. Squash. Eggplant. Herbs. Peppers. Onions. Cucumbers. Brussels sprouts. Grains Whole grains, such as whole-wheat or whole-grain breads, crackers, cereals, and pasta. Unsweetened oatmeal. Bulgur. Barley. Quinoa. Brown rice. Corn or whole-wheat flour tortillas or taco shells. Meats and other proteins Seafood. Poultry without skin. Lean cuts of pork and beef. Tofu. Eggs. Nuts. Beans. Dairy Low-fat or fat-free dairy products, such as yogurt, cottage cheese, and cheese. Beverages Water. Tea. Coffee. Sugar-free or diet soda. Seltzer water. Low-fat or nonfat milk. Milk alternatives, such as soy or almond milk. Fats and oils Olive oil. Canola oil. Sunflower oil. Grapeseed oil. Avocado. Walnuts. Sweets and desserts Sugar-free or low-fat pudding. Sugar-free or low-fat ice cream and other frozen treats. Seasonings and condiments Herbs. Sodium-free spices. Mustard. Relish. Low-salt, low-sugar ketchup. Low-salt, low-sugar barbecue sauce. Low-fat or fat-free mayonnaise. The items listed above may not be a complete list of recommended foods and beverages. Contact a dietitian for more information. What foods are not  recommended?  Fruits Fruits canned with syrup. Vegetables Canned vegetables. Frozen vegetables with butter or cream sauce. Grains Refined white flour and flour products, such as bread, pasta, snack foods, and cereals. Meats and other proteins Fatty cuts of meat. Poultry with skin. Breaded or fried meat. Processed meats. Dairy Full-fat yogurt, cheese, or milk. Beverages Sweetened drinks, such as iced tea and soda. Fats and oils Butter. Lard. Ghee. Sweets and desserts Baked goods, such as cake, cupcakes, pastries, cookies, and cheesecake. Seasonings and condiments Spice mixes with added salt. Ketchup. Barbecue sauce. Mayonnaise. The items listed above may not be a complete list of foods and beverages that are not recommended. Contact a dietitian for more information. Where to find more information American Diabetes Association: www.diabetes.org Summary You may need to make diet and lifestyle changes to help prevent the onset of diabetes. These changes can help you control blood sugar, improve cholesterol levels, and manage blood pressure. Set weight loss goals with help from your health care team. It is recommended that most people with prediabetes lose 7% of their body weight. Consider following a Mediterranean diet. This includes eating plenty of fresh fruits and vegetables, whole grains, beans, nuts, seeds, fish, and low-fat dairy, and using olive oil instead of other fats. This information is not intended to replace advice given to you by your health care provider. Make sure you discuss any questions you have with your health care provider. Document Revised: 07/26/2019 Document Reviewed: 07/26/2019 Elsevier Patient Education  [place].  Recombinant Zoster (Shingles) Vaccine: What You Need to Know 1. Why get vaccinated? Recombinant zoster (shingles) vaccine can prevent shingles. Shingles (also called herpes zoster, or just zoster) is a painful skin rash, usually with  blisters. In addition to the rash, shingles can cause fever, headache, chills, or upset stomach. Rarely, shingles can lead to complications such as pneumonia, hearing problems, blindness, brain inflammation (encephalitis), or death. The risk of shingles increases with age. The most common complication of shingles is long-term nerve pain called postherpetic neuralgia (PHN). PHN occurs in the areas where the shingles rash was and can last for months or years after the rash goes away. The pain from PHN can be severe and debilitating. The risk of PHN increases with age. An older adult with shingles is more likely to develop PHN and have longer lasting and more severe pain than a younger person. People with weakened immune systems also have a higher risk of getting shingles and complications from the disease. Shingles is caused by varicella-zoster virus, the same virus that causes chickenpox. After you have chickenpox, the virus stays in your body and can cause shingles later in life. Shingles cannot be passed from one person to another, but the virus that causes shingles can spread and cause chickenpox in someone who has never had chickenpox or has never received chickenpox vaccine. 2. Recombinant shingles vaccine Recombinant shingles vaccine provides strong protection against shingles. By preventing shingles, recombinant shingles vaccine also protects against PHN and other complications. Recombinant shingles vaccine is recommended for: Adults 81 years and older Adults 19 years and older who have a weakened immune system because of disease or treatments Shingles vaccine is given as a two-dose series. For most people, the second dose should be given 2 to 6 months after the first dose. Some people who have or will have a weakened immune system can get the second dose 1 to 2 months after the first dose. Ask your health care provider for guidance. People who have had shingles  in the past and people who have  received varicella (chickenpox) vaccine are recommended to get recombinant shingles vaccine. The vaccine is also recommended for people who have already gotten another type of shingles vaccine, the live shingles vaccine. There is no live virus in recombinant shingles vaccine. Shingles vaccine may be given at the same time as other vaccines. 3. Talk with your health care provider Tell your vaccination provider if the person getting the vaccine: Has had an allergic reaction after a previous dose of recombinant shingles vaccine, or has any severe, life-threatening allergies Is currently experiencing an episode of shingles Is pregnant In some cases, your health care provider may decide to postpone shingles vaccination until a future visit. People with minor illnesses, such as a cold, may be vaccinated. People who are moderately or severely ill should usually wait until they recover before getting recombinant shingles vaccine. Your health care provider can give you more information. 4. Risks of a vaccine reaction A sore arm with mild or moderate pain is very common after recombinant shingles vaccine. Redness and swelling can also happen at the site of the injection. Tiredness, muscle pain, headache, shivering, fever, stomach pain, and nausea are common after recombinant shingles vaccine. These side effects may temporarily prevent a vaccinated person from doing regular activities. Symptoms usually go away on their own in 2 to 3 days. You should still get the second dose of recombinant shingles vaccine even if you had one of these reactions after the first dose. Guillain-Barr syndrome (GBS), a serious nervous system disorder, has been reported very rarely after recombinant zoster vaccine. People sometimes faint after medical procedures, including vaccination. Tell your provider if you feel dizzy or have vision changes or ringing in the ears. As with any medicine, there is a very remote chance of a vaccine  causing a severe allergic reaction, other serious injury, or death. 5. What if there is a serious problem? An allergic reaction could occur after the vaccinated person leaves the clinic. If you see signs of a severe allergic reaction (hives, swelling of the face and throat, difficulty breathing, a fast heartbeat, dizziness, or weakness), call 9-1-1 and get the person to the nearest hospital. For other signs that concern you, call your health care provider. Adverse reactions should be reported to the Vaccine Adverse Event Reporting System (VAERS). Your health care provider will usually file this report, or you can do it yourself. Visit the VAERS website at www.vaers.SamedayNews.es or call 805-488-9891. VAERS is only for reporting reactions, and VAERS staff members do not give medical advice. 6. How can I learn more? Ask your health care provider. Call your local or state health department. Visit the website of the Food and Drug Administration (FDA) for vaccine package inserts and additional information at http://lopez-wang.org/. Contact the Centers for Disease Control and Prevention (CDC): Call 562-269-5833 (1-800-CDC-INFO) or Visit CDC's website at http://hunter.com/. Source: CDC Vaccine Information Statement Recombinant Zoster Vaccine (06/13/2020) This same material is available at http://www.wolf.info/ for no charge. This information is not intended to replace advice given to you by your health care provider. Make sure you discuss any questions you have with your health care provider. Document Revised: 03/25/2021 Document Reviewed: 06/27/2020 Elsevier Patient Education  [place].

## 2021-11-12 LAB — CBC WITH DIFFERENTIAL/PLATELET
Basophils Absolute: 0.1 10*3/uL (ref 0.0–0.2)
Basos: 1 %
EOS (ABSOLUTE): 0.3 10*3/uL (ref 0.0–0.4)
Eos: 6 %
Hematocrit: 41 % (ref 37.5–51.0)
Hemoglobin: 14 g/dL (ref 13.0–17.7)
Immature Grans (Abs): 0 10*3/uL (ref 0.0–0.1)
Immature Granulocytes: 1 %
Lymphocytes Absolute: 1.7 10*3/uL (ref 0.7–3.1)
Lymphs: 32 %
MCH: 29.7 pg (ref 26.6–33.0)
MCHC: 34.1 g/dL (ref 31.5–35.7)
MCV: 87 fL (ref 79–97)
Monocytes Absolute: 0.5 10*3/uL (ref 0.1–0.9)
Monocytes: 9 %
Neutrophils Absolute: 2.8 10*3/uL (ref 1.4–7.0)
Neutrophils: 51 %
Platelets: 145 10*3/uL — ABNORMAL LOW (ref 150–450)
RBC: 4.72 x10E6/uL (ref 4.14–5.80)
RDW: 11.9 % (ref 11.6–15.4)
WBC: 5.4 10*3/uL (ref 3.4–10.8)

## 2021-11-12 LAB — COMPREHENSIVE METABOLIC PANEL
ALT: 29 IU/L (ref 0–44)
AST: 23 IU/L (ref 0–40)
Albumin/Globulin Ratio: 1.9 (ref 1.2–2.2)
Albumin: 4.3 g/dL (ref 3.8–4.9)
Alkaline Phosphatase: 58 IU/L (ref 44–121)
BUN/Creatinine Ratio: 14 (ref 9–20)
BUN: 13 mg/dL (ref 6–24)
Bilirubin Total: 0.4 mg/dL (ref 0.0–1.2)
CO2: 23 mmol/L (ref 20–29)
Calcium: 9.5 mg/dL (ref 8.7–10.2)
Chloride: 104 mmol/L (ref 96–106)
Creatinine, Ser: 0.96 mg/dL (ref 0.76–1.27)
Globulin, Total: 2.3 g/dL (ref 1.5–4.5)
Glucose: 119 mg/dL — ABNORMAL HIGH (ref 70–99)
Potassium: 4.9 mmol/L (ref 3.5–5.2)
Sodium: 142 mmol/L (ref 134–144)
Total Protein: 6.6 g/dL (ref 6.0–8.5)
eGFR: 93 mL/min/{1.73_m2} (ref >59)

## 2021-11-12 LAB — LIPID PANEL
Chol/HDL Ratio: 3 ratio (ref 0.0–5.0)
Cholesterol, Total: 116 mg/dL (ref 100–199)
HDL: 39 mg/dL — ABNORMAL LOW (ref >39)
LDL Chol Calc (NIH): 63 mg/dL (ref 0–99)
Triglycerides: 67 mg/dL (ref 0–149)
VLDL Cholesterol Cal: 14 mg/dL (ref 5–40)

## 2021-11-12 LAB — CARDIOVASCULAR RISK ASSESSMENT

## 2021-11-12 LAB — HEMOGLOBIN A1C
Est. average glucose Bld gHb Est-mCnc: 134 mg/dL
Hgb A1c MFr Bld: 6.3 % — ABNORMAL HIGH (ref 4.8–5.6)

## 2021-11-23 DIAGNOSIS — M170 Bilateral primary osteoarthritis of knee: Secondary | ICD-10-CM | POA: Diagnosis not present

## 2021-12-06 ENCOUNTER — Other Ambulatory Visit: Payer: Self-pay | Admitting: Allergy

## 2022-01-18 ENCOUNTER — Encounter: Payer: Self-pay | Admitting: Allergy

## 2022-01-19 ENCOUNTER — Ambulatory Visit: Payer: BC Managed Care – PPO | Admitting: Allergy

## 2022-01-21 NOTE — Telephone Encounter (Signed)
Patient called the office and rescheduled.

## 2022-02-09 ENCOUNTER — Other Ambulatory Visit: Payer: Self-pay | Admitting: Nurse Practitioner

## 2022-02-09 DIAGNOSIS — E786 Lipoprotein deficiency: Secondary | ICD-10-CM

## 2022-02-09 DIAGNOSIS — E785 Hyperlipidemia, unspecified: Secondary | ICD-10-CM

## 2022-02-09 DIAGNOSIS — E039 Hypothyroidism, unspecified: Secondary | ICD-10-CM

## 2022-02-15 NOTE — Patient Instructions (Signed)
Asthma Continue Symbicort 160-2 puffs twice a day with a spacer to prevent cough or wheeze Continue albuterol 2 puffs once every 4 hours as needed for cough or wheeze You may use albuterol 2 puffs 5 to 15 minutes before activity to decrease cough or wheeze  Allergic rhinitis Continue allergen avoidance measures directed toward dust mite, cat, dog, and mold as listed below Continue Xyzal 5 mg once a day as needed for runny nose or itch Continue Xhance 2 sprays in each nostril twice a day as needed for stuffy nose or nasal symptoms Consider saline nasal rinses as needed for nasal symptoms. Use this before any medicated nasal sprays for best result Consider allergen immunotherapy if your symptoms are not well controlled with the treatment plan as listed above  Call the clinic if this treatment plan is not working well for you.  Follow up in *** or sooner if needed.   Control of Dust Mite Allergen Dust mites play a major role in allergic asthma and rhinitis. They occur in environments with high humidity wherever human skin is found. Dust mites absorb humidity from the atmosphere (ie, they do not drink) and feed on organic matter (including shed human and animal skin). Dust mites are a microscopic type of insect that you cannot see with the naked eye. High levels of dust mites have been detected from mattresses, pillows, carpets, upholstered furniture, bed covers, clothes, soft toys and any woven material. The principal allergen of the dust mite is found in its feces. A gram of dust may contain 1,000 mites and 250,000 fecal particles. Mite antigen is easily measured in the air during house cleaning activities. Dust mites do not bite and do not cause harm to humans, other than by triggering allergies/asthma.  Ways to decrease your exposure to dust mites in your home:  1. Encase mattresses, box springs and pillows with a mite-impermeable barrier or cover  2. Wash sheets, blankets and drapes weekly  in hot water (130 F) with detergent and dry them in a dryer on the hot setting.  3. Have the room cleaned frequently with a vacuum cleaner and a damp dust-mop. For carpeting or rugs, vacuuming with a vacuum cleaner equipped with a high-efficiency particulate air (HEPA) filter. The dust mite allergic individual should not be in a room which is being cleaned and should wait 1 hour after cleaning before going into the room.  4. Do not sleep on upholstered furniture (eg, couches).  5. If possible removing carpeting, upholstered furniture and drapery from the home is ideal. Horizontal blinds should be eliminated in the rooms where the person spends the most time (bedroom, study, television room). Washable vinyl, roller-type shades are optimal.  6. Remove all non-washable stuffed toys from the bedroom. Wash stuffed toys weekly like sheets and blankets above.  7. Reduce indoor humidity to less than 50%. Inexpensive humidity monitors can be purchased at most hardware stores. Do not use a humidifier as can make the problem worse and are not recommended.   Control of Dog or Cat Allergen Avoidance is the best way to manage a dog or cat allergy. If you have a dog or cat and are allergic to dog or cats, consider removing the dog or cat from the home. If you have a dog or cat but don't want to find it a new home, or if your family wants a pet even though someone in the household is allergic, here are some strategies that may help keep symptoms at bay:  Keep the pet out of your bedroom and restrict it to only a few rooms. Be advised that keeping the dog or cat in only one room will not limit the allergens to that room. Don't pet, hug or kiss the dog or cat; if you do, wash your hands with soap and water. High-efficiency particulate air (HEPA) cleaners run continuously in a bedroom or living room can reduce allergen levels over time. Regular use of a high-efficiency vacuum cleaner or a central vacuum can reduce  allergen levels. Giving your dog or cat a bath at least once a week can reduce airborne allergen.  Control of Mold Allergen Mold and fungi can grow on a variety of surfaces provided certain temperature and moisture conditions exist.  Outdoor molds grow on plants, decaying vegetation and soil.  The major outdoor mold, Alternaria and Cladosporium, are found in very high numbers during hot and dry conditions.  Generally, a late Summer - Fall peak is seen for common outdoor fungal spores.  Rain will temporarily lower outdoor mold spore count, but counts rise rapidly when the rainy period ends.  The most important indoor molds are Aspergillus and Penicillium.  Dark, humid and poorly ventilated basements are ideal sites for mold growth.  The next most common sites of mold growth are the bathroom and the kitchen.  Outdoor [employer] Use air conditioning and keep windows closed Avoid exposure to decaying vegetation. Avoid leaf raking. Avoid grain handling. Consider wearing a face mask if working in moldy areas.  Indoor Mold Control Maintain humidity below 50%. Clean washable surfaces with 5% bleach solution. Remove sources e.g. Contaminated carpets.

## 2022-02-15 NOTE — Progress Notes (Unsigned)
   [address] [place] [address] Dept: [phone]  FOLLOW UP NOTE  Patient ID: Elijah Small, male    DOB: 01/13/1966  Age: 56 y.o. MRN: [id number] Date of Office Visit: 02/16/2022  Assessment  Chief Complaint: No chief complaint on file.  HPI Elijah Small is a 56 year old male who presents to the visit.  He was last seen in this clinic on 10/20/2021 by Dr. Nelva Bush for evaluation of asthma and allergic rhinitis.  His last environmental allergy skin testing was on 10/23/2014 and was positive to dust mite, cat, dog, and molds.   Drug Allergies:  No Known Allergies  Physical Exam: There were no vitals taken for this visit.   Physical Exam  Diagnostics:    Assessment and Plan: No diagnosis found.  No orders of the defined types were placed in this encounter.   There are no Patient Instructions on file for this visit.  No follow-ups on file.    Thank you for the opportunity to care for this patient.  Please do not hesitate to contact me with questions.  Gareth Morgan, FNP [facility]

## 2022-02-16 ENCOUNTER — Ambulatory Visit: Payer: BC Managed Care – PPO | Admitting: Family Medicine

## 2022-02-16 ENCOUNTER — Encounter: Payer: Self-pay | Admitting: Family Medicine

## 2022-02-16 VITALS — BP 110/62 | HR 90 | Resp 16

## 2022-02-16 DIAGNOSIS — J3089 Other allergic rhinitis: Secondary | ICD-10-CM

## 2022-02-16 DIAGNOSIS — J4540 Moderate persistent asthma, uncomplicated: Secondary | ICD-10-CM

## 2022-02-16 MED ORDER — MONTELUKAST SODIUM 10 MG PO TABS: ORAL_TABLET | ORAL | 5 refills | Status: DC

## 2022-03-15 ENCOUNTER — Encounter: Payer: Self-pay | Admitting: Nurse Practitioner

## 2022-03-15 DIAGNOSIS — M170 Bilateral primary osteoarthritis of knee: Secondary | ICD-10-CM | POA: Diagnosis not present

## 2022-04-17 ENCOUNTER — Other Ambulatory Visit: Payer: Self-pay | Admitting: Nurse Practitioner

## 2022-04-17 DIAGNOSIS — F419 Anxiety disorder, unspecified: Secondary | ICD-10-CM

## 2022-05-17 ENCOUNTER — Encounter: Payer: Self-pay | Admitting: Nurse Practitioner

## 2022-05-17 ENCOUNTER — Ambulatory Visit (INDEPENDENT_AMBULATORY_CARE_PROVIDER_SITE_OTHER): Payer: BC Managed Care – PPO | Admitting: Nurse Practitioner

## 2022-05-17 VITALS — BP 128/74 | HR 82 | Temp 97.1°F | Ht 69.0 in | Wt 209.0 lb

## 2022-05-17 DIAGNOSIS — E782 Mixed hyperlipidemia: Secondary | ICD-10-CM | POA: Diagnosis not present

## 2022-05-17 DIAGNOSIS — E039 Hypothyroidism, unspecified: Secondary | ICD-10-CM | POA: Diagnosis not present

## 2022-05-17 DIAGNOSIS — Z8042 Family history of malignant neoplasm of prostate: Secondary | ICD-10-CM

## 2022-05-17 DIAGNOSIS — Z23 Encounter for immunization: Secondary | ICD-10-CM | POA: Diagnosis not present

## 2022-05-17 DIAGNOSIS — E1165 Type 2 diabetes mellitus with hyperglycemia: Secondary | ICD-10-CM | POA: Diagnosis not present

## 2022-05-17 DIAGNOSIS — K219 Gastro-esophageal reflux disease without esophagitis: Secondary | ICD-10-CM | POA: Diagnosis not present

## 2022-05-17 DIAGNOSIS — F419 Anxiety disorder, unspecified: Secondary | ICD-10-CM

## 2022-05-17 DIAGNOSIS — Z125 Encounter for screening for malignant neoplasm of prostate: Secondary | ICD-10-CM

## 2022-05-17 NOTE — Patient Instructions (Addendum)
We will call you with lab results Continue medications Follow-up in 83-month, fasting    Health Maintenance, Male Adopting a healthy lifestyle and getting preventive care are important in promoting health and wellness. Ask your health care provider about: The right schedule for you to have regular tests and exams. Things you can do on your own to prevent diseases and keep yourself healthy. What should I know about diet, weight, and exercise? Eat a healthy diet  Eat a diet that includes plenty of vegetables, fruits, low-fat dairy products, and lean protein. Do not eat a lot of foods that are high in solid fats, added sugars, or sodium. Maintain a healthy weight Body mass index (BMI) is a measurement that can be used to identify possible weight problems. It estimates body fat based on height and weight. Your health care provider can help determine your BMI and help you achieve or maintain a healthy weight. Get regular exercise Get regular exercise. This is one of the most important things you can do for your health. Most adults should: Exercise for at least 150 minutes each week. The exercise should increase your heart rate and make you sweat (moderate-intensity exercise). Do strengthening exercises at least twice a week. This is in addition to the moderate-intensity exercise. Spend less time sitting. Even light physical activity can be beneficial. Watch cholesterol and blood lipids Have your blood tested for lipids and cholesterol at 57years of age, then have this test every 5 years. You may need to have your cholesterol levels checked more often if: Your lipid or cholesterol levels are high. You are older than 57years of age. You are at high risk for heart disease. What should I know about cancer screening? Many types of cancers can be detected early and may often be prevented. Depending on your health history and family history, you may need to have cancer screening at various ages.  This may include screening for: Colorectal cancer. Prostate cancer. Skin cancer. Lung cancer. What should I know about heart disease, diabetes, and high blood pressure? Blood pressure and heart disease High blood pressure causes heart disease and increases the risk of stroke. This is more likely to develop in people who have high blood pressure readings or are overweight. Talk with your health care provider about your target blood pressure readings. Have your blood pressure checked: Every 3-5 years if you are [age]years of age. Every year if you are 477years old or older. If you are between the ages of 674and 710and are a current or former smoker, ask your health care provider if you should have a one-time screening for abdominal aortic aneurysm (AAA). Diabetes Have regular diabetes screenings. This checks your fasting blood sugar level. Have the screening done: Once every three years after age 89if you are at a normal weight and have a low risk for diabetes. More often and at a younger age if you are overweight or have a high risk for diabetes. What should I know about preventing infection? Hepatitis B If you have a higher risk for hepatitis B, you should be screened for this virus. Talk with your health care provider to find out if you are at risk for hepatitis B infection. Hepatitis C Blood testing is recommended for: Everyone born from [birth year]through 1965. Anyone with known risk factors for hepatitis C. Sexually transmitted infections (STIs) You should be screened each year for STIs, including gonorrhea and chlamydia, if: You are sexually active and are  younger than 57 years of age. You are older than 57 years of age and your health care provider tells you that you are at risk for this type of infection. Your sexual activity has changed since you were last screened, and you are at increased risk for chlamydia or gonorrhea. Ask your health care provider if you are at risk. Ask your  health care provider about whether you are at high risk for HIV. Your health care provider may recommend a prescription medicine to help prevent HIV infection. If you choose to take medicine to prevent HIV, you should first get tested for HIV. You should then be tested every 3 months for as long as you are taking the medicine. Follow these instructions at home: Alcohol use Do not drink alcohol if your health care provider tells you not to drink. If you drink alcohol: Limit how much you have to 0-2 drinks a day. Know how much alcohol is in your drink. In the [place] one drink equals one 12 oz bottle of beer (355 mL), one 5 oz glass of wine (148 mL), or one 1 oz glass of hard liquor (44 mL). Lifestyle Do not use any products that contain nicotine or tobacco. These products include cigarettes, chewing tobacco, and vaping devices, such as e-cigarettes. If you need help quitting, ask your health care provider. Do not use street drugs. Do not share needles. Ask your health care provider for help if you need support or information about quitting drugs. General instructions Schedule regular health, dental, and eye exams. Stay current with your vaccines. Tell your health care provider if: You often feel depressed. You have ever been abused or do not feel safe at home. Summary Adopting a healthy lifestyle and getting preventive care are important in promoting health and wellness. Follow your health care provider's instructions about healthy diet, exercising, and getting tested or screened for diseases. Follow your health care provider's instructions on monitoring your cholesterol and blood pressure. This information is not intended to replace advice given to you by your health care provider. Make sure you discuss any questions you have with your health care provider. Document Revised: 09/15/2020 Document Reviewed: 09/15/2020 Elsevier Patient Education  [place].

## 2022-05-17 NOTE — Progress Notes (Signed)
Subjective:  Patient ID: Elijah Small, male    DOB: [birth year]  Age: 57 y.o. MRN: [id number]  Chief Complaint  Patient presents with   Hypothyroidism   Hyperlipidemia   Hypertension     HPI:  Pt presents for follow-up of chronic medical problems of HTN, Type2DM, and Hyperlipidemia. States he has a skin lesion to right upper arm that he is "watching". History of skin cancer. Last eye exam Jan 2023. Elijah Small has lost 35 lbs since last visit 22-month ago using [facility] program. Current weight 209 lb, BMI 30.86. States he feels well. Obtained flu vaccine today. Declined COVID-19 booster at this time.   Hypertension, follow-up: He was last seen for hypertension 6 months ago.  BP at that visit was 122/78. Management includes diet and exercise. He is following a Regular diet. He is not exercising. He does not smoke.   Lipid/Cholesterol, Follow-up  Last lipid panel Other pertinent labs  Lab Results  Component Value Date   CHOL 116 11/11/2021   HDL 39 (L) 11/11/2021   LDLCALC 63 11/11/2021   TRIG 67 11/11/2021   CHOLHDL 3.0 11/11/2021   Lab Results  Component Value Date   ALT 29 11/11/2021   AST 23 11/11/2021   PLT 145 (L) 11/11/2021   TSH 2.920 05/13/2021     Management includes Crestor 40 mg He reports excellent compliance with treatment. He is not having side effects.    Hypothyroidism, follow-up: [name] a past history of hypothyroidism for several years. Current treatment is Levothyroxine 75 mcg daily. Last TSH 2.920 on 05/13/2021. He denies side effects of medication or symptoms of hypo/hyperthyroidism. He is adherent to medication regimen and follow-up appointments.   Pertinent labs: Lab Results  Component Value Date   CHOL 116 11/11/2021   HDL 39 (L) 11/11/2021   LDLCALC 63 11/11/2021   TRIG 67 11/11/2021   CHOLHDL 3.0 11/11/2021   Lab Results  Component Value Date   NA 142 11/11/2021   K 4.9 11/11/2021   CREATININE 0.96 11/11/2021   EGFR 93  11/11/2021   GFRNONAA 78 01/29/2020   GLUCOSE 119 (H) 11/11/2021      Diabetes Mellitus Type II, Follow-up  Lab Results  Component Value Date   HGBA1C 6.3 (H) 11/11/2021   HGBA1C 6.4 (H) 05/13/2021   HGBA1C 6.7 (H) 07/29/2020   Wt Readings from Last 3 Encounters:  05/17/22 209 lb (94.8 kg)  11/11/21 244 lb 9.6 oz (110.9 kg)  10/20/21 243 lb 9.6 oz (110.5 kg)   Management  includes Metformin 500 mg BID He reports excellent compliance with treatment. He is not having side effects.   Most Recent Eye Exam: Due for eye exam   Pertinent Labs: Lab Results  Component Value Date   CHOL 116 11/11/2021   HDL 39 (L) 11/11/2021   LDLCALC 63 11/11/2021   TRIG 67 11/11/2021   CHOLHDL 3.0 11/11/2021   Lab Results  Component Value Date   NA 142 11/11/2021   K 4.9 11/11/2021   CREATININE 0.96 11/11/2021   EGFR 93 11/11/2021   GFRNONAA 78 01/29/2020   GLUCOSE 119 (H) 11/11/2021       Pertinent Labs:    Component Value Date/Time   CHOL 116 11/11/2021 1005   TRIG 67 11/11/2021 1005   CHOLHDL 3.0 11/11/2021 1005   CHOLHDL 4.5 06/12/2013 0831   CREATININE 0.96 11/11/2021 1005   CREATININE 0.98 06/12/2013 0831    Wt Readings from Last 3 Encounters:  05/17/22 209  lb (94.8 kg)  11/11/21 244 lb 9.6 oz (110.9 kg)  10/20/21 243 lb 9.6 oz (110.5 kg)    Anxiety, Follow-up  Current treatment includes sertraline 50 mg QD.  He reports excellent compliance with treatment. He reports excellent tolerance of treatment. He is not having side effects.   He feels his anxiety is mild and Improved since last visit.   GAD-7 Results    05/17/2022   10:07 AM 11/11/2021    9:29 AM 07/09/2019   11:06 AM  GAD-7 Generalized Anxiety Disorder Screening Tool  1. Feeling Nervous, Anxious, or on Edge 0 0 0  2. Not Being Able to Stop or Control Worrying 0 0 0  3. Worrying Too Much About Different Things 0 0 0  4. Trouble Relaxing 0 0 0  5. Being So Restless it's Hard To Sit Still 0 0 0  6.  Becoming Easily Annoyed or Irritable 0 0 0  7. Feeling Afraid As If Something Awful Might Happen 0 0 0  Total GAD-7 Score 0 0 0  Difficulty At Work, Home, or Getting  Along With Others? Not difficult at all Not difficult at all Not difficult at all    PHQ-9 Scores    05/17/2022   10:06 AM 05/13/2021    9:51 AM 08/07/2019   10:59 AM  PHQ9 SCORE ONLY  PHQ-9 Total Score 0 0 0     Current Outpatient Medications on File Prior to Visit  Medication Sig Dispense Refill   budesonide-formoterol (SYMBICORT) 160-4.5 MCG/ACT inhaler Inhale 2 puffs into the lungs 2 (two) times daily. 1 each 5   levothyroxine (SYNTHROID) 75 MCG tablet TAKE 1 TABLET DAILY BEFORE BREAKFAST 90 tablet 2   metFORMIN (GLUCOPHAGE) 500 MG tablet Take 1 tablet (500 mg total) by mouth 2 (two) times daily with a meal. 180 tablet 3   rosuvastatin (CRESTOR) 40 MG tablet TAKE 1 TABLET(40 MG) BY MOUTH AT BEDTIME 90 tablet 2   sertraline (ZOLOFT) 50 MG tablet TAKE 1 TABLET(50 MG) BY MOUTH DAILY 90 tablet 0   VENTOLIN HFA 108 (90 Base) MCG/ACT inhaler INHALE 2 PUFFS INTO THE LUNGS EVERY 6 HOURS AS NEEDED FOR WHEEZING OR SHORTNESS OF BREATH 18 g 1   No current facility-administered medications on file prior to visit.   Past Medical History:  Diagnosis Date   Anxiety    Asthma    Diverticulitis    History of colon polyps    Mixed hyperlipidemia    Past Surgical History:  Procedure Laterality Date   COLECTOMY  2208   12 inches - Dr. [facility]   COLOSTOMY  2009   colectomy    KNEE SURGERY Bilateral     Family History  Problem Relation Age of Onset   Hypertension Father    Heart disease Father        He died at 76 from MI    Colon polyps Father        age of onset unknown   Stroke Mother    Dementia Mother    Breast cancer Mother    Cancer Brother        thyroid   Diabetes Brother    Hypertension Brother    Asthma Son    Allergic rhinitis Son    Asthma Daughter    Allergic rhinitis Daughter    Colon cancer  Paternal Uncle        early 62s   Esophageal cancer Neg Hx    Rectal cancer Neg  Hx    Stomach cancer Neg Hx    Social History   Socioeconomic History   Marital status: Married    Spouse name: Not on file   Number of children: 3   Years of education: Not on file   Highest education level: Not on file  Occupational History   Occupation: Therapist, nutritional  Tobacco Use   Smoking status: Never   Smokeless tobacco: Current    Types: Snuff  Vaping Use   Vaping Use: Never used  Substance and Sexual Activity   Alcohol use: No   Drug use: No   Sexual activity: Yes    Partners: Female  Other Topics Concern   Not on file  Social History Narrative   Marital Status: Married [place])   Children: Son (1), Daughters (2)    Pets: Dogs (4)     Living Situation: Lives with wife and kids.   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[employer])    Education: [employer]    Tobacco Use/Exposure:  None    Alcohol Use:  None   Drug Use:  None   Diet:  Regular   Exercise:  Limited    Hobbies: Runner, broadcasting/film/video    Social Determinants of Health   Financial Resource Strain: Low Risk  (11/11/2021)   Overall Financial Resource Strain (CARDIA)    Difficulty of Paying Living Expenses: Not hard at all  Food Insecurity: No Food Insecurity (11/11/2021)   Hunger Vital Sign    Worried About Running Out of Food in the Last Year: Never true    Ran Out of Food in the Last Year: Never true  Transportation Needs: No Transportation Needs (11/11/2021)   PRAPARE - Hydrologist (Medical): No    Lack of Transportation (Non-Medical): No  Physical Activity: Inactive (11/11/2021)   Exercise Vital Sign    Days of Exercise per Week: 0 days    Minutes of Exercise per Session: 0 min  Stress: No Stress Concern Present (11/11/2021)   [place]    Feeling of Stress : Not at all  Social Connections: Moderately  Integrated (11/11/2021)   Social Connection and Isolation Panel [NHANES]    Frequency of Communication with Friends and Family: More than three times a week    Frequency of Social Gatherings with Friends and Family: More than three times a week    Attends Religious Services: More than 4 times per year    Active Member of [employer] or Organizations: No    Attends Archivist Meetings: Never    Marital Status: Married    Review of Systems  Constitutional:  Negative for chills, diaphoresis, fatigue and fever.  HENT:  Negative for congestion, ear pain and sore throat.   Respiratory:  Negative for cough and shortness of breath.   Cardiovascular:  Negative for chest pain and leg swelling.  Gastrointestinal:  Negative for abdominal pain, constipation, diarrhea, nausea and vomiting.  Genitourinary:  Negative for dysuria and urgency.  Musculoskeletal:  Negative for arthralgias and myalgias.  Neurological:  Negative for dizziness and headaches.  Psychiatric/Behavioral:  Negative for dysphoric mood.      Objective:  BP 128/74   Pulse 82   Temp (!) 97.1 F (36.2 C)   Ht '5\' 9"'$  (1.753 m)   Wt 209 lb (94.8 kg)   SpO2 98%   BMI 30.86 kg/m  05/17/2022   10:04 AM 02/16/2022   11:35 AM 11/11/2021    9:23 AM  BP/Weight  Systolic BP  664 403  Diastolic BP  62 78  Wt. (Lbs) 209  244.6  BMI 30.86 kg/m2  36.12 kg/m2    Physical Exam Constitutional:      Appearance: Normal appearance.  HENT:     Right Ear: Tympanic membrane normal.     Left Ear: Tympanic membrane normal.     Nose: Nose normal.     Mouth/Throat:     Mouth: Mucous membranes are moist.  Eyes:     Pupils: Pupils are equal, round, and reactive to light.     Comments: Eyeglasses in place  Neck:     Vascular: No carotid bruit.  Cardiovascular:     Rate and Rhythm: Normal rate and regular rhythm.     Pulses: Normal pulses.     Heart sounds: Normal heart sounds. No murmur heard. Pulmonary:     Effort: Pulmonary  effort is normal.     Breath sounds: Normal breath sounds.  Abdominal:     General: Bowel sounds are normal.     Palpations: Abdomen is soft. There is no mass.     Tenderness: There is no abdominal tenderness.  Musculoskeletal:        General: No swelling.  Skin:    General: Skin is warm.     Capillary Refill: Capillary refill takes less than 2 seconds.  Neurological:     Mental Status: He is alert and oriented to person, place, and time.  Psychiatric:        Mood and Affect: Mood normal.        Behavior: Behavior normal.         Lab Results  Component Value Date   WBC 5.4 11/11/2021   HGB 14.0 11/11/2021   HCT 41.0 11/11/2021   PLT 145 (L) 11/11/2021   GLUCOSE 119 (H) 11/11/2021   CHOL 116 11/11/2021   TRIG 67 11/11/2021   HDL 39 (L) 11/11/2021   LDLCALC 63 11/11/2021   ALT 29 11/11/2021   AST 23 11/11/2021   NA 142 11/11/2021   K 4.9 11/11/2021   CL 104 11/11/2021   CREATININE 0.96 11/11/2021   BUN 13 11/11/2021   CO2 23 11/11/2021   TSH 2.920 05/13/2021   HGBA1C 6.3 (H) 11/11/2021   MICROALBUR 10 05/13/2021      Assessment & Plan:   1. Type 2 diabetes mellitus with hyperglycemia, without long-term current use of insulin (HCC)-well controlled - CBC with Differential/Platelet - Comprehensive metabolic panel - Lipid panel - TSH - Hemoglobin A1c -continue Metformin 500 mg BID  2. Mixed hyperlipidemia-well controlled - CBC with Differential/Platelet - Comprehensive metabolic panel - Lipid panel -continue Crestor 40 mg QD, pending labs  3. Gastroesophageal reflux disease, unspecified whether esophagitis present-well controlled - CBC with Differential/Platelet - Comprehensive metabolic panel -avoid foods that trigger GERD  4. Anxiety-well controlled - CBC with Differential/Platelet - Comprehensive metabolic panel - TSH -continue zoloft 50 mg QD  5. Acquired hypothyroidism-well controlled - TSH -continue Levothyroxine 75 mcg QD  6. Encounter  for immunization - Flu Vaccine MDCK QUAD PF  7. Encounter for screening for malignant neoplasm of prostate - PSA  8. Family history of prostate cancer in father - PSA   We will call you with lab results Continue medications Follow-up in 75-month, fasting    Follow-up: 621-month fasting  An After Visit Summary was printed and given to  the patient.  I, Rip Harbour, NP, have reviewed all documentation for this visit. The documentation on 05/17/22 for the exam, diagnosis, procedures, and orders are all accurate and complete.   Signed, Rip Harbour, NP [facility] [phone]

## 2022-05-18 LAB — HEMOGLOBIN A1C
Est. average glucose Bld gHb Est-mCnc: 120 mg/dL
Hgb A1c MFr Bld: 5.8 % — ABNORMAL HIGH (ref 4.8–5.6)

## 2022-05-18 LAB — CBC WITH DIFFERENTIAL/PLATELET
Basophils Absolute: 0 10*3/uL (ref 0.0–0.2)
Basos: 1 %
EOS (ABSOLUTE): 0.2 10*3/uL (ref 0.0–0.4)
Eos: 4 %
Hematocrit: 41 % (ref 37.5–51.0)
Hemoglobin: 14.1 g/dL (ref 13.0–17.7)
Immature Grans (Abs): 0.1 10*3/uL (ref 0.0–0.1)
Immature Granulocytes: 1 %
Lymphocytes Absolute: 1.6 10*3/uL (ref 0.7–3.1)
Lymphs: 26 %
MCH: 29.2 pg (ref 26.6–33.0)
MCHC: 34.4 g/dL (ref 31.5–35.7)
MCV: 85 fL (ref 79–97)
Monocytes Absolute: 0.5 10*3/uL (ref 0.1–0.9)
Monocytes: 8 %
Neutrophils Absolute: 3.7 10*3/uL (ref 1.4–7.0)
Neutrophils: 60 %
Platelets: 185 10*3/uL (ref 150–450)
RBC: 4.83 x10E6/uL (ref 4.14–5.80)
RDW: 11.7 % (ref 11.6–15.4)
WBC: 6 10*3/uL (ref 3.4–10.8)

## 2022-05-18 LAB — LIPID PANEL
Chol/HDL Ratio: 3.1 ratio (ref 0.0–5.0)
Cholesterol, Total: 117 mg/dL (ref 100–199)
HDL: 38 mg/dL — ABNORMAL LOW (ref >39)
LDL Chol Calc (NIH): 61 mg/dL (ref 0–99)
Triglycerides: 91 mg/dL (ref 0–149)
VLDL Cholesterol Cal: 18 mg/dL (ref 5–40)

## 2022-05-18 LAB — COMPREHENSIVE METABOLIC PANEL
ALT: 26 IU/L (ref 0–44)
AST: 21 IU/L (ref 0–40)
Albumin/Globulin Ratio: 1.7 (ref 1.2–2.2)
Albumin: 4.3 g/dL (ref 3.8–4.9)
Alkaline Phosphatase: 65 IU/L (ref 44–121)
BUN/Creatinine Ratio: 12 (ref 9–20)
BUN: 10 mg/dL (ref 6–24)
Bilirubin Total: 0.5 mg/dL (ref 0.0–1.2)
CO2: 24 mmol/L (ref 20–29)
Calcium: 9.6 mg/dL (ref 8.7–10.2)
Chloride: 102 mmol/L (ref 96–106)
Creatinine, Ser: 0.84 mg/dL (ref 0.76–1.27)
Globulin, Total: 2.5 g/dL (ref 1.5–4.5)
Glucose: 103 mg/dL — ABNORMAL HIGH (ref 70–99)
Potassium: 4.6 mmol/L (ref 3.5–5.2)
Sodium: 139 mmol/L (ref 134–144)
Total Protein: 6.8 g/dL (ref 6.0–8.5)
eGFR: 102 mL/min/{1.73_m2} (ref >59)

## 2022-05-18 LAB — TSH: TSH: 3.57 u[IU]/mL (ref 0.450–4.500)

## 2022-05-18 LAB — PSA: Prostate Specific Ag, Serum: 1 ng/mL (ref 0.0–4.0)

## 2022-05-18 LAB — CARDIOVASCULAR RISK ASSESSMENT

## 2022-06-15 ENCOUNTER — Ambulatory Visit: Payer: BC Managed Care – PPO | Admitting: Allergy

## 2022-07-15 ENCOUNTER — Other Ambulatory Visit: Payer: Self-pay

## 2022-07-15 DIAGNOSIS — F419 Anxiety disorder, unspecified: Secondary | ICD-10-CM

## 2022-07-16 MED ORDER — SERTRALINE HCL 50 MG PO TABS: ORAL_TABLET | ORAL | 0 refills | Status: DC

## 2022-08-06 ENCOUNTER — Other Ambulatory Visit: Payer: Self-pay

## 2022-08-06 DIAGNOSIS — E1169 Type 2 diabetes mellitus with other specified complication: Secondary | ICD-10-CM

## 2022-08-06 MED ORDER — METFORMIN HCL 500 MG PO TABS: 500.0000 mg | ORAL_TABLET | Freq: Two times a day (BID) | ORAL | 0 refills | Status: DC

## 2022-10-17 ENCOUNTER — Other Ambulatory Visit: Payer: Self-pay | Admitting: Family Medicine

## 2022-10-17 DIAGNOSIS — F419 Anxiety disorder, unspecified: Secondary | ICD-10-CM

## 2022-11-01 ENCOUNTER — Other Ambulatory Visit: Payer: Self-pay

## 2022-11-01 DIAGNOSIS — E786 Lipoprotein deficiency: Secondary | ICD-10-CM

## 2022-11-01 DIAGNOSIS — E039 Hypothyroidism, unspecified: Secondary | ICD-10-CM

## 2022-11-01 DIAGNOSIS — E785 Hyperlipidemia, unspecified: Secondary | ICD-10-CM

## 2022-11-01 MED ORDER — ROSUVASTATIN CALCIUM 40 MG PO TABS: ORAL_TABLET | ORAL | 0 refills | Status: DC

## 2022-11-01 MED ORDER — LEVOTHYROXINE SODIUM 75 MCG PO TABS
75.0000 ug | ORAL_TABLET | Freq: Every day | ORAL | 2 refills | Status: DC
Start: 2022-11-01 — End: 2023-07-27

## 2022-11-16 ENCOUNTER — Ambulatory Visit: Payer: BC Managed Care – PPO | Admitting: Nurse Practitioner

## 2022-11-29 ENCOUNTER — Ambulatory Visit: Payer: BC Managed Care – PPO | Admitting: Physician Assistant

## 2023-01-28 ENCOUNTER — Other Ambulatory Visit: Payer: Self-pay | Admitting: Family Medicine

## 2023-01-28 DIAGNOSIS — F419 Anxiety disorder, unspecified: Secondary | ICD-10-CM

## 2023-01-28 DIAGNOSIS — E786 Lipoprotein deficiency: Secondary | ICD-10-CM

## 2023-01-28 DIAGNOSIS — E785 Hyperlipidemia, unspecified: Secondary | ICD-10-CM

## 2023-02-07 ENCOUNTER — Other Ambulatory Visit: Payer: Self-pay

## 2023-02-07 DIAGNOSIS — F419 Anxiety disorder, unspecified: Secondary | ICD-10-CM

## 2023-02-07 MED ORDER — SERTRALINE HCL 50 MG PO TABS
ORAL_TABLET | ORAL | 0 refills | Status: DC
Start: 2023-02-07 — End: 2023-03-08

## 2023-02-10 ENCOUNTER — Encounter: Payer: Self-pay | Admitting: Family Medicine

## 2023-02-10 ENCOUNTER — Ambulatory Visit: Payer: 59 | Admitting: Family Medicine

## 2023-02-10 VITALS — BP 128/82 | HR 89 | Temp 97.9°F | Ht 69.0 in | Wt 222.0 lb

## 2023-02-10 DIAGNOSIS — E039 Hypothyroidism, unspecified: Secondary | ICD-10-CM

## 2023-02-10 DIAGNOSIS — Z1211 Encounter for screening for malignant neoplasm of colon: Secondary | ICD-10-CM | POA: Diagnosis not present

## 2023-02-10 DIAGNOSIS — E782 Mixed hyperlipidemia: Secondary | ICD-10-CM

## 2023-02-10 DIAGNOSIS — E1165 Type 2 diabetes mellitus with hyperglycemia: Secondary | ICD-10-CM

## 2023-02-10 NOTE — Assessment & Plan Note (Signed)
Well controlled The current medical regimen is effective;  continue present plan and medications.  Metformin 500 mg by mouth TWICE A DAY Labs today

## 2023-02-10 NOTE — Assessment & Plan Note (Signed)
Continue all medications as directed. Remain hydrated and follow mediterranean diet. Resume physical activity to continue decreasing BMI Continue to abstain from tobacco/vape/excessive ETOH use  FU in 6 months, fasting labs

## 2023-02-10 NOTE — Assessment & Plan Note (Signed)
Labs drawn today Well controlled on Levothyroxine 75 mcg every day

## 2023-02-10 NOTE — Assessment & Plan Note (Addendum)
Labs drawn today Well controlled on Crestor 40 mg @ bedtime Continue to work on diet and exercise

## 2023-02-10 NOTE — Assessment & Plan Note (Signed)
Body mass index is 32.78 kg/m.  Current weight 222 Encouraged to follow heart healthy/mediterranean diet and start exercising again.

## 2023-02-10 NOTE — Progress Notes (Signed)
Subjective:  Patient ID: Elijah Small, male    DOB: August 24, 1965  Age: 57 y.o. MRN: [id number]  Chief Complaint  Patient presents with   Medical Management of Chronic Issues    HPI   Hypertension, follow-up: He was last seen for hypertension 6 months ago. Denies chest pain or shortness of breath BP at that visit was 122/78. 128/74 on 05/17/22 He is following a Regular diet. He is not exercising. He does not smoke.  DMII: Management  includes Metformin 500 mg BID He reports excellent compliance with treatment. He is not having side effects.    Hyperlipidemia:  Management with Crestor 40 mg @ bedtime He reports compliance with treatment and no side effects. Last HDL - 38  Hypothyroidism, follow-up: Elijah Small has a past history of hypothyroidism for several years. Current treatment is Levothyroxine 75 mcg daily. Last TSH 3.570 on 05/17/2022. He denies side effects of medication or symptoms of hypo/hyperthyroidism. He is adherent to medication regimen and follow-up appointments.  Morbid Obesity:  Patient currently not exercising.  Patient is down 20 lbs from his last appointment.  Anxiety, Follow-up Current treatment includes sertraline 50 mg QD.          He reports excellent compliance with treatment. He reports excellent tolerance of treatment. He is not having side effects.   Colonoscopy due - patient thought it was every five years. Last colonoscopy was 09/2019. He will call to verify with Dr. Russella Small. Most Recent Eye Exam: Due for eye exam     02/10/2023    9:04 AM 05/17/2022   10:06 AM 05/13/2021    9:51 AM 08/07/2019   10:59 AM 07/09/2019   10:24 AM  Depression screen PHQ 2/9  Decreased Interest 0 0 0 0 0  Down, Depressed, Hopeless 0  0 0 0  PHQ - 2 Score 0 0 0 0 0  Altered sleeping 0 0  0 0  Tired, decreased energy 0 0  0 0  Change in appetite 0 0  0 0  Feeling bad or failure about yourself  0 0  0 0  Trouble concentrating 0 0  0 0  Moving slowly or  fidgety/restless 0 0  0 0  Suicidal thoughts 0 0  0 0  PHQ-9 Score 0 0  0 0  Difficult doing work/chores Not difficult at all Not difficult at all           02/10/2023    9:04 AM  Fall Risk   Falls in the past year? 0  Number falls in past yr: 0  Injury with Fall? 0  Risk for fall due to : No Fall Risks  Follow up Falls evaluation completed    Patient Care Team: Renne Crigler, FNP as PCP - General (Family Medicine) Nyoka Cowden, MD as Consulting Physician (Pulmonary Disease)   Review of Systems  Constitutional:  Negative for chills, diaphoresis, fatigue and fever.  HENT:  Negative for congestion, ear pain and sore throat.   Respiratory:  Negative for cough and shortness of breath.   Cardiovascular:  Negative for chest pain and leg swelling.  Gastrointestinal:  Negative for abdominal pain, constipation, diarrhea, nausea and vomiting.  Genitourinary:  Negative for dysuria, frequency and urgency.  Musculoskeletal:  Negative for arthralgias and myalgias.  Neurological:  Negative for dizziness and headaches.  Psychiatric/Behavioral:  Negative for dysphoric mood.     Current Outpatient Medications on File Prior to Visit  Medication Sig Dispense Refill   budesonide-formoterol (SYMBICORT)  160-4.5 MCG/ACT inhaler Inhale 2 puffs into the lungs 2 (two) times daily. 1 each 5   levothyroxine (SYNTHROID) 75 MCG tablet Take 1 tablet (75 mcg total) by mouth daily before breakfast. 90 tablet 2   metFORMIN (GLUCOPHAGE) 500 MG tablet Take 1 tablet (500 mg total) by mouth 2 (two) times daily with a meal. 180 tablet 0   rosuvastatin (CRESTOR) 40 MG tablet TAKE 1 TABLET(40 MG) BY MOUTH AT BEDTIME 90 tablet 0   sertraline (ZOLOFT) 50 MG tablet TAKE 1 TABLET(50 MG) BY MOUTH DAILY 30 tablet 0   VENTOLIN HFA 108 (90 Base) MCG/ACT inhaler INHALE 2 PUFFS INTO THE LUNGS EVERY 6 HOURS AS NEEDED FOR WHEEZING OR SHORTNESS OF BREATH 18 g 1   No current facility-administered medications on file prior to  visit.   Past Medical History:  Diagnosis Date   Anxiety    Asthma    Diverticulitis    History of colon polyps    Mixed hyperlipidemia    Past Surgical History:  Procedure Laterality Date   COLECTOMY  2208   12 inches - Dr. [facility]   COLOSTOMY  2009   colectomy    KNEE SURGERY Bilateral     Family History  Problem Relation Age of Onset   Hypertension Father    Heart disease Father        He died at 74 from MI    Colon polyps Father        age of onset unknown   Stroke Mother    Dementia Mother    Breast cancer Mother    Cancer Brother        thyroid   Diabetes Brother    Hypertension Brother    Asthma Son    Allergic rhinitis Son    Asthma Daughter    Allergic rhinitis Daughter    Colon cancer Paternal Uncle        early 67s   Esophageal cancer Neg Hx    Rectal cancer Neg Hx    Stomach cancer Neg Hx    Social History   Socioeconomic History   Marital status: Married    Spouse name: Not on file   Number of children: 3   Years of education: Not on file   Highest education level: Not on file  Occupational History   Occupation: Financial planner  Tobacco Use   Smoking status: Never   Smokeless tobacco: Current    Types: Snuff  Vaping Use   Vaping status: Never Used  Substance and Sexual Activity   Alcohol use: No   Drug use: No   Sexual activity: Yes    Partners: Female  Other Topics Concern   Not on file  Social History Narrative   Marital Status: Married [place])   Children: Son (1), Daughters (2)    Pets: Dogs (4)     Living Situation: Lives with wife and kids.   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[employer])    Education: [employer]    Tobacco Use/Exposure:  None    Alcohol Use:  None   Drug Use:  None   Diet:  Regular   Exercise:  Limited    Hobbies: Animal nutritionist    Social Determinants of Health   Financial Resource Strain: Low Risk  (02/09/2023)   Overall Financial Resource Strain (CARDIA)     Difficulty of Paying Living Expenses: Not hard at all  Food Insecurity: No Food Insecurity (  02/09/2023)   Hunger Vital Sign    Worried About Running Out of Food in the Last Year: Never true    Ran Out of Food in the Last Year: Never true  Transportation Needs: No Transportation Needs (02/09/2023)   PRAPARE - Administrator, Civil Service (Medical): No    Lack of Transportation (Non-Medical): No  Physical Activity: Insufficiently Active (02/09/2023)   Exercise Vital Sign    Days of Exercise per Week: 2 days    Minutes of Exercise per Session: 10 min  Stress: No Stress Concern Present (02/09/2023)   [employer] of Occupational Health - Occupational Stress Questionnaire    Feeling of Stress : Not at all  Social Connections: Unknown (02/09/2023)   Social Connection and Isolation Panel [NHANES]    Frequency of Communication with Friends and Family: Patient declined    Frequency of Social Gatherings with Friends and Family: Patient declined    Attends Religious Services: More than 4 times per year    Active Member of [employer] or Organizations: Patient declined    Attends Engineer, structural: Not on file    Marital Status: Married    Objective:  BP 128/82   Pulse 89   Temp 97.9 F (36.6 C)   Ht 5\' 9"  (1.753 m)   Wt 222 lb (100.7 kg)   SpO2 97%   BMI 32.78 kg/m      02/10/2023    8:58 AM 05/17/2022   10:04 AM 02/16/2022   11:35 AM  BP/Weight  Systolic BP 128 128 110  Diastolic BP 82 74 62  Wt. (Lbs) 222 209   BMI 32.78 kg/m2 30.86 kg/m2     Physical Exam Vitals reviewed.  Constitutional:      General: He is not in acute distress.    Appearance: Normal appearance. He is obese. He is not ill-appearing.  Cardiovascular:     Rate and Rhythm: Normal rate and regular rhythm.     Heart sounds: Normal heart sounds.  Pulmonary:     Effort: Pulmonary effort is normal.     Breath sounds: Normal breath sounds. No wheezing.  Abdominal:     General: Abdomen is  flat. Bowel sounds are normal.     Palpations: Abdomen is soft.     Tenderness: There is no abdominal tenderness.  Musculoskeletal:        General: Normal range of motion.  Skin:    General: Skin is warm.  Neurological:     Mental Status: He is alert and oriented to person, place, and time.  Psychiatric:        Mood and Affect: Mood normal.     Diabetic Foot Exam - Simple   Simple Foot Form Visual Inspection No deformities, no ulcerations, no other skin breakdown bilaterally: Yes Sensation Testing Intact to touch and monofilament testing bilaterally: Yes Pulse Check Posterior Tibialis and Dorsalis pulse intact bilaterally: Yes Comments      Lab Results  Component Value Date   WBC 6.0 05/17/2022   HGB 14.1 05/17/2022   HCT 41.0 05/17/2022   PLT 185 05/17/2022   GLUCOSE 103 (H) 05/17/2022   CHOL 117 05/17/2022   TRIG 91 05/17/2022   HDL 38 (L) 05/17/2022   LDLCALC 61 05/17/2022   ALT 26 05/17/2022   AST 21 05/17/2022   NA 139 05/17/2022   K 4.6 05/17/2022   CL 102 05/17/2022   CREATININE 0.84 05/17/2022   BUN 10 05/17/2022   CO2 24 05/17/2022  TSH 3.570 05/17/2022   HGBA1C 5.8 (H) 05/17/2022   MICROALBUR 10 05/13/2021      Assessment & Plan:    Type 2 diabetes mellitus with hyperglycemia, without long-term current use of insulin (HCC) Assessment & Plan: Well controlled The current medical regimen is effective;  continue present plan and medications.  Metformin 500 mg by mouth TWICE A DAY Labs today  Orders: -     CBC with Differential/Platelet -     Comprehensive metabolic panel -     Hemoglobin A1c -     Microalbumin / creatinine urine ratio  Morbid obesity (HCC) Assessment & Plan: Body mass index is 32.78 kg/m.  Current weight 222 Encouraged to follow heart healthy/mediterranean diet and start exercising again.    Mixed hyperlipidemia Assessment & Plan: Labs drawn today Well controlled on Crestor 40 mg @ bedtime Continue to work on diet  and exercise  Orders: -     Lipid panel  Acquired hypothyroidism Assessment & Plan: Labs drawn today Well controlled on Levothyroxine 75 mcg every day  Orders: -     TSH -     T4, free  Screen for colon cancer Assessment & Plan: Continue all medications as directed. Remain hydrated and follow mediterranean diet. Resume physical activity to continue decreasing BMI Continue to abstain from tobacco/vape/excessive ETOH use  FU in 6 months, fasting labs  Orders: -     Ambulatory referral to Gastroenterology     No orders of the defined types were placed in this encounter.   Orders Placed This Encounter  Procedures   CBC with Differential/Platelet   Comprehensive metabolic panel   Lipid panel   Hemoglobin A1c   Microalbumin / creatinine urine ratio   TSH   T4, Free   Ambulatory referral to Gastroenterology     Follow-up: Return in about 6 months (around 08/11/2023).   I,Katherina A Bramblett,acting as a scribe for Renne Crigler, FNP.,have documented all relevant documentation on the behalf of Renne Crigler, FNP,as directed by  Renne Crigler, FNP while in the presence of Renne Crigler, FNP.   Total time spent on today's visit was greater than 30 minutes, including both face-to-face time and nonface-to-face time personally spent on review of chart (labs and imaging), discussing labs and goals, discussing further work-up, treatment options, referrals to specialist if needed, reviewing outside records if pertinent, answering patient's questions, and coordinating care.   An After Visit Summary was printed and given to the patient.  Lajuana Matte, FNP [facility] [phone]

## 2023-02-11 LAB — CBC WITH DIFFERENTIAL/PLATELET
Basophils Absolute: 0.1 10*3/uL (ref 0.0–0.2)
Basos: 1 %
EOS (ABSOLUTE): 0.6 10*3/uL — ABNORMAL HIGH (ref 0.0–0.4)
Eos: 6 %
Hematocrit: 43.7 % (ref 37.5–51.0)
Hemoglobin: 14.3 g/dL (ref 13.0–17.7)
Immature Grans (Abs): 0.1 10*3/uL (ref 0.0–0.1)
Immature Granulocytes: 1 %
Lymphocytes Absolute: 1.5 10*3/uL (ref 0.7–3.1)
Lymphs: 15 %
MCH: 29.1 pg (ref 26.6–33.0)
MCHC: 32.7 g/dL (ref 31.5–35.7)
MCV: 89 fL (ref 79–97)
Monocytes Absolute: 0.9 10*3/uL (ref 0.1–0.9)
Monocytes: 9 %
Neutrophils Absolute: 6.9 10*3/uL (ref 1.4–7.0)
Neutrophils: 68 %
Platelets: 151 10*3/uL (ref 150–450)
RBC: 4.91 x10E6/uL (ref 4.14–5.80)
RDW: 12.4 % (ref 11.6–15.4)
WBC: 10 10*3/uL (ref 3.4–10.8)

## 2023-02-11 LAB — MICROALBUMIN / CREATININE URINE RATIO
Creatinine, Urine: 149 mg/dL
Microalb/Creat Ratio: <2 mg/g{creat} (ref 0–29)
Microalbumin, Urine: <3 ug/mL

## 2023-02-11 LAB — COMPREHENSIVE METABOLIC PANEL
ALT: 19 [IU]/L (ref 0–44)
AST: 16 [IU]/L (ref 0–40)
Albumin: 4.6 g/dL (ref 3.8–4.9)
Alkaline Phosphatase: 69 [IU]/L (ref 44–121)
BUN/Creatinine Ratio: 9 (ref 9–20)
BUN: 10 mg/dL (ref 6–24)
Bilirubin Total: 0.4 mg/dL (ref 0.0–1.2)
CO2: 23 mmol/L (ref 20–29)
Calcium: 9.5 mg/dL (ref 8.7–10.2)
Chloride: 101 mmol/L (ref 96–106)
Creatinine, Ser: 1.06 mg/dL (ref 0.76–1.27)
Globulin, Total: 2.3 g/dL (ref 1.5–4.5)
Glucose: 118 mg/dL — ABNORMAL HIGH (ref 70–99)
Potassium: 5 mmol/L (ref 3.5–5.2)
Sodium: 136 mmol/L (ref 134–144)
Total Protein: 6.9 g/dL (ref 6.0–8.5)
eGFR: 82 mL/min/{1.73_m2} (ref >59)

## 2023-02-11 LAB — LIPID PANEL
Chol/HDL Ratio: 3.1 {ratio} (ref 0.0–5.0)
Cholesterol, Total: 131 mg/dL (ref 100–199)
HDL: 42 mg/dL (ref >39)
LDL Chol Calc (NIH): 76 mg/dL (ref 0–99)
Triglycerides: 59 mg/dL (ref 0–149)
VLDL Cholesterol Cal: 13 mg/dL (ref 5–40)

## 2023-02-11 LAB — HEMOGLOBIN A1C
Est. average glucose Bld gHb Est-mCnc: 134 mg/dL
Hgb A1c MFr Bld: 6.3 % — ABNORMAL HIGH (ref 4.8–5.6)

## 2023-02-11 LAB — TSH: TSH: 3.65 u[IU]/mL (ref 0.450–4.500)

## 2023-02-11 LAB — T4, FREE: Free T4: 1.26 ng/dL (ref 0.82–1.77)

## 2023-03-02 ENCOUNTER — Other Ambulatory Visit: Payer: Self-pay | Admitting: Allergy

## 2023-03-08 ENCOUNTER — Other Ambulatory Visit: Payer: Self-pay | Admitting: Family Medicine

## 2023-03-08 DIAGNOSIS — F419 Anxiety disorder, unspecified: Secondary | ICD-10-CM

## 2023-04-19 ENCOUNTER — Encounter: Payer: Self-pay | Admitting: Allergy

## 2023-04-19 ENCOUNTER — Ambulatory Visit: Payer: 59 | Admitting: Allergy

## 2023-04-19 VITALS — BP 120/70 | HR 82 | Resp 16

## 2023-04-19 DIAGNOSIS — J4540 Moderate persistent asthma, uncomplicated: Secondary | ICD-10-CM | POA: Diagnosis not present

## 2023-04-19 DIAGNOSIS — J3089 Other allergic rhinitis: Secondary | ICD-10-CM

## 2023-04-19 DIAGNOSIS — J329 Chronic sinusitis, unspecified: Secondary | ICD-10-CM

## 2023-04-19 MED ORDER — XHANCE 93 MCG/ACT NA EXHU: INHALANT_SUSPENSION | NASAL | 11 refills | Status: AC

## 2023-04-19 NOTE — Progress Notes (Signed)
Follow-up Note  RE: Elijah Small MRN: [id number] DOB: 25-Apr-1966 Date of Office Visit: 04/19/2023   History of present illness: Elijah Small is a 57 y.o. male presenting today for follow-up of asthma, allergic rhinitis.  He was last seen in the office on 02/16/22 by our NP Ambs.   Discussed the use of AI scribe software for clinical note transcription with the patient, who gave verbal consent to proceed.  He reports a significant worsening of his allergic symptoms over the past few months. He attributes this deterioration to an inability to access his usual medication, Xhance, which had previously controlled his symptoms effectively.  He lost his job and insurance and thus was not able to continue on [place] during that time.  The patient has attempted to manage his symptoms with Flonase and other over-the-counter remedies, but these have proven ineffective. He describes daily sinus pressure pain, particularly above the eyebrow, which has been responsive to Tylenol. The patient also uses a CPAP machine, which may be contributing to his nasal congestion.  He fortunately has not required antibiotics for sinus infections since discontinuing Xhance  In terms of his asthma history, the patient reports infrequent use of his rescue inhaler, only three or four times in the past year, typically triggered by cold air. He is not currently on any maintenance therapies such as Symbicort or montelukast, relying solely on his albuterol rescue inhaler. Despite this, he reports no significant breathing issues.    Review of systems: 10pt ROS negative unless noted above in HPI  All other systems negative unless noted above in HPI  Past medical/social/surgical/family history have been reviewed and are unchanged unless specifically indicated below.  No changes  Medication List: Current Outpatient Medications  Medication Sig Dispense Refill   levothyroxine (SYNTHROID) 75 MCG tablet Take 1  tablet (75 mcg total) by mouth daily before breakfast. 90 tablet 2   metFORMIN (GLUCOPHAGE) 500 MG tablet Take 1 tablet (500 mg total) by mouth 2 (two) times daily with a meal. 180 tablet 0   rosuvastatin (CRESTOR) 40 MG tablet TAKE 1 TABLET(40 MG) BY MOUTH AT BEDTIME 90 tablet 0   sertraline (ZOLOFT) 50 MG tablet TAKE 1 TABLET(50 MG) BY MOUTH DAILY 90 tablet 1   VENTOLIN HFA 108 (90 Base) MCG/ACT inhaler INHALE 2 PUFFS INTO THE LUNGS EVERY 6 HOURS AS NEEDED FOR WHEEZING OR SHORTNESS OF BREATH 18 g 1   XHANCE 93 MCG/ACT EXHU Use 2 sprays in each nostril twice daily 16 mL 11   No current facility-administered medications for this visit.     Known medication allergies: No Known Allergies   Physical examination: Blood pressure 120/70, pulse 82, resp. rate 16, SpO2 97%.  General: Alert, interactive, in no acute distress. HEENT: PERRLA, TMs pearly gray, turbinates moderately edematous without discharge, post-pharynx non erythematous. Neck: Supple without lymphadenopathy. Lungs: Clear to auscultation without wheezing, rhonchi or rales. {no increased work of breathing. CV: Normal S1, S2 without murmurs. Abdomen: Nondistended, nontender. Skin: Warm and dry, without lesions or rashes. Extremities:  No clubbing, cyanosis or edema. Neuro:   Grossly intact.  Diagnositics/Labs: Spirometry: FEV1: 3.31L 93%, FVC: 4.23L 93%, ratio consistent with nonobstructive pattern  Assessment and plan: Chronic rhinosinusitis, allergic Continue allergen avoidance measures directed toward dust mite, cat, dog, and mold as listed below Can use an over-the-counter antihistamine (ie Xyzal, Allegra, Zyrtec) once a day as needed for general allergy symptom control Resume Xhance 2 sprays in each nostril 1-2 times a day as  needed for nasal congestion, sinus pressure/pain Consider saline nasal rinses as needed for nasal symptoms. Use this before any medicated nasal sprays for best result Consider allergen immunotherapy  if your symptoms are not well controlled with the treatment plan as listed above  Asthma Doing well without need for maintenance medications  Lung function looks great today!! Continue albuterol 2 puffs once every 4 hours as needed for cough or wheeze You may use albuterol 2 puffs 5 to 15 minutes before activity to decrease cough or wheeze  Follow up in 4-6 months or sooner if needed.  I appreciate the opportunity to take part in [name]'s care. Please do not hesitate to contact me with questions.  Sincerely,   Margo Aye, MD Allergy/Immunology [facility]

## 2023-04-19 NOTE — Patient Instructions (Addendum)
Chronic rhinitis, allergic Continue allergen avoidance measures directed toward dust mite, cat, dog, and mold as listed below Can use an over-the-counter antihistamine (ie Xyzal, Allegra, Zyrtec) once a day as needed for general allergy symptom control Resume Xhance 2 sprays in each nostril 1-2 times a day as needed for nasal congestion, sinus pressure/pain Consider saline nasal rinses as needed for nasal symptoms. Use this before any medicated nasal sprays for best result Consider allergen immunotherapy if your symptoms are not well controlled with the treatment plan as listed above  Asthma Doing well without need for maintenance medications  Lung function looks great today!! Continue albuterol 2 puffs once every 4 hours as needed for cough or wheeze You may use albuterol 2 puffs 5 to 15 minutes before activity to decrease cough or wheeze  Follow up in 4-6 months or sooner if needed.

## 2023-05-05 ENCOUNTER — Telehealth: Payer: Self-pay

## 2023-05-05 NOTE — Telephone Encounter (Signed)
Called Elijah Small, Made her aware patient has a pre op appointment on 05/17/23, and asked about labs, she will fax over the labs they did but was not aware patient was a diabetic so they need will need a A1C.  Patient made aware to be fasting at appointment.

## 2023-05-05 NOTE — Telephone Encounter (Signed)
Copied from CRM [id number]. Topic: General - Other >> May 05, 2023 10:10 AM Donita Brooks wrote: Reason for CRM: [name] from [facility] is calling because a request was sent in for clarence for surgery. Pt has surgery on Jan 23 - call back  [phone]

## 2023-05-17 ENCOUNTER — Ambulatory Visit: Payer: 59 | Admitting: Family Medicine

## 2023-05-19 ENCOUNTER — Ambulatory Visit: Payer: 59 | Admitting: Family Medicine

## 2023-05-20 ENCOUNTER — Ambulatory Visit: Payer: 59 | Admitting: Family Medicine

## 2023-05-20 ENCOUNTER — Encounter: Payer: Self-pay | Admitting: Family Medicine

## 2023-05-20 VITALS — BP 102/68 | HR 83 | Temp 97.7°F | Resp 16 | Ht 69.0 in | Wt 232.4 lb

## 2023-05-20 DIAGNOSIS — Z01818 Encounter for other preprocedural examination: Secondary | ICD-10-CM | POA: Diagnosis not present

## 2023-05-20 DIAGNOSIS — E1165 Type 2 diabetes mellitus with hyperglycemia: Secondary | ICD-10-CM

## 2023-05-20 NOTE — Assessment & Plan Note (Addendum)
 It is my assessment that Cutberto is a low risk for this low risk surgery based on the below risk assessment tools.  BP Readings from Last 3 Encounters:  05/20/23 102/68  04/19/23 120/70  02/10/23 128/82    Gupta Perioperative Risk for Myocardial Infarction or Cardiac Arrest (MICA) from [email or web address]  on 05/20/2023 ** All calculations should be rechecked by clinician prior to use **  RESULT SUMMARY: 0.0 % Risk of myocardial infarction or cardiac arrest, intraoperatively or up to 30 days post-op   INPUTS: Age --> 57 years Functional status --> 0 = Independent ASA class --> -5.17 = 1: normal healthy patient Creatinine --> 0 = Normal (<=1.5 mg/dL, 866 mol/L)  Revised Cardiac Risk Index for Pre-Operative Risk from [email or web address]  on 05/20/2023 ** All calculations should be rechecked by clinician prior to use **  RESULT SUMMARY: 0 points RCRI Score  3.9 % Risk of major cardiac event   INPUTS: High-risk surgery --> 0 = No History of ischemic heart disease --> 0 = No History of congestive heart failure --> 0 = No History of cerebrovascular disease --> 0 = No Pre-operative treatment with insulin --> 0 = No Pre-operative creatinine >2 mg/dL / 823.1 mol/L --> 0 = No  Preoperative Mortality Predictor (PMP) Score from [email or web address]  on 05/20/2023 ** All calculations should be rechecked by clinician prior to use **  RESULT SUMMARY: -1 points PMP Score  0.1 % Risk of perioperative mortality   INPUTS: Inpatient --> 0 = No Sepsis --> 0 = No Poor functional status --> 0 = No Disseminated cancer --> 0 = No Age, years --> 0 = <65 Cardiac comorbidity --> 0 = No Pulmonary comorbidity --> 0 = No Renal comorbidity --> 0 = No Liver comorbidity --> 0 = No Steroids for chronic condition --> 0 = No Weight loss --> 0 = No Bleeding disorder --> 0 = No DNR status --> 0 = No Obesity --> -1 = Yes

## 2023-05-20 NOTE — Progress Notes (Signed)
 Subjective:  Patient ID: Elijah Small, male    DOB: [birth year]  Age: 58 y.o. MRN: [id number]  Chief Complaint  Patient presents with   Pre-op Exam   Discussed the use of AI scribe software for clinical note transcription with the patient, who gave verbal consent to proceed.    HPI The patient, with a history of prediabetes, hyperthyroidism, and obstructive sleep apnea, presents for a preoperative evaluation for knee surgery. They deny any family history of reactions to anesthesia and personal history of reactions to anesthesia. They have no loose or missing teeth and no decreased range of motion of their neck. They are able to walk up two flights of stairs without becoming significantly short of breath or having chest pain. They have had recent labs done elsewhere but need a Hemoglobin A1c test done here. They are currently taking metformin .   Patient is here for a Pre-operative physical at the request of Fonda Olmsted, MD. He is having arthroplasty of the right knee.    Personal or family hx of adverse outcome to anesthesia? No  Chipped, cracked, missing, or loose teeth? No  Decreased ROM of neck? No  Able to walk up 2 flights of stairs without becoming significantly short of breath or having chest pain? Yes         02/10/2023    9:04 AM 05/17/2022   10:06 AM 05/13/2021    9:51 AM 08/07/2019   10:59 AM 07/09/2019   10:24 AM  Depression screen PHQ 2/9  Decreased Interest 0 0 0 0 0  Down, Depressed, Hopeless 0  0 0 0  PHQ - 2 Score 0 0 0 0 0  Altered sleeping 0 0  0 0  Tired, decreased energy 0 0  0 0  Change in appetite 0 0  0 0  Feeling bad or failure about yourself  0 0  0 0  Trouble concentrating 0 0  0 0  Moving slowly or fidgety/restless 0 0  0 0  Suicidal thoughts 0 0  0 0  PHQ-9 Score 0 0  0 0  Difficult doing work/chores Not difficult at all Not difficult at all           02/10/2023    9:04 AM  Fall Risk   Falls in the past year? 0  Number falls in past yr: 0   Injury with Fall? 0  Risk for fall due to : No Fall Risks  Follow up Falls evaluation completed    Patient Care Team: Teressa Harrie HERO, FNP as PCP - General (Family Medicine) Darlean Ozell NOVAK, MD as Consulting Physician (Pulmonary Disease)   Review of Systems  Constitutional:  Negative for chills, diaphoresis, fatigue and fever.  HENT:  Negative for congestion, ear pain and sinus pain.   Respiratory:  Negative for cough and shortness of breath.   Cardiovascular:  Negative for chest pain.  Gastrointestinal:  Negative for abdominal pain, constipation, nausea and vomiting.  Genitourinary:  Negative for dysuria.  Musculoskeletal:  Positive for arthralgias (right knee).  Neurological:  Negative for weakness and headaches.  Psychiatric/Behavioral:  Negative for dysphoric mood. The patient is not nervous/anxious.     Current Outpatient Medications on File Prior to Visit  Medication Sig Dispense Refill   levothyroxine  (SYNTHROID ) 75 MCG tablet Take 1 tablet (75 mcg total) by mouth daily before breakfast. 90 tablet 2   metFORMIN  (GLUCOPHAGE ) 500 MG tablet Take 1 tablet (500 mg total) by mouth 2 (two) times daily  with a meal. 180 tablet 0   rosuvastatin  (CRESTOR ) 40 MG tablet TAKE 1 TABLET(40 MG) BY MOUTH AT BEDTIME 90 tablet 0   sertraline  (ZOLOFT ) 50 MG tablet TAKE 1 TABLET(50 MG) BY MOUTH DAILY 90 tablet 1   XHANCE  93 MCG/ACT EXHU Use 2 sprays in each nostril twice daily 16 mL 11   No current facility-administered medications on file prior to visit.   Past Medical History:  Diagnosis Date   Anxiety    Asthma    Diverticulitis    History of colon polyps    Mixed hyperlipidemia    Past Surgical History:  Procedure Laterality Date   COLECTOMY  2208   12 inches - Dr. Merrily[facility]   COLOSTOMY  2009   colectomy    KNEE SURGERY Bilateral     Family History  Problem Relation Age of Onset   Hypertension Father    Heart disease Father        He died at 46 from MI    Colon polyps  Father        age of onset unknown   Stroke Mother    Dementia Mother    Breast cancer Mother    Cancer Brother        thyroid    Diabetes Brother    Hypertension Brother    Asthma Son    Allergic rhinitis Son    Asthma Daughter    Allergic rhinitis Daughter    Colon cancer Paternal Uncle        early 68s   Esophageal cancer Neg Hx    Rectal cancer Neg Hx    Stomach cancer Neg Hx    Social History   Socioeconomic History   Marital status: Married    Spouse name: Not on file   Number of children: 3   Years of education: Not on file   Highest education level: 12th grade  Occupational History   Occupation: Financial planner  Tobacco Use   Smoking status: Never   Smokeless tobacco: Current    Types: Snuff  Vaping Use   Vaping status: Never Used  Substance and Sexual Activity   Alcohol use: No   Drug use: No   Sexual activity: Yes    Partners: Female  Other Topics Concern   Not on file  Social History Narrative   Marital Status: Married [place])   Children: Son (1), Daughters (2)    Pets: Dogs (4)     Living Situation: Lives with wife and kids.   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[employer])    Education: [employer]    Tobacco Use/Exposure:  None    Alcohol Use:  None   Drug Use:  None   Diet:  Regular   Exercise:  Limited    Hobbies: Animal Nutritionist    Social Drivers of Health   Financial Resource Strain: Low Risk  (05/18/2023)   Overall Financial Resource Strain (CARDIA)    Difficulty of Paying Living Expenses: Not hard at all  Food Insecurity: No Food Insecurity (05/18/2023)   Hunger Vital Sign    Worried About Running Out of Food in the Last Year: Never true    Ran Out of Food in the Last Year: Never true  Transportation Needs: No Transportation Needs (05/18/2023)   PRAPARE - Administrator, Civil Service (Medical): No    Lack of Transportation (Non-Medical): No  Physical Activity: Insufficiently Active (05/18/2023)  Exercise Vital Sign    Days of Exercise per Week: 2 days    Minutes of Exercise per Session: 30 min  Stress: No Stress Concern Present (05/18/2023)   [employer] of Occupational Health - Occupational Stress Questionnaire    Feeling of Stress : Not at all  Social Connections: Socially Integrated (05/18/2023)   Social Connection and Isolation Panel [NHANES]    Frequency of Communication with Friends and Family: Once a week    Frequency of Social Gatherings with Friends and Family: More than three times a week    Attends Religious Services: More than 4 times per year    Active Member of [employer] or Organizations: Yes    Attends Engineer, Structural: More than 4 times per year    Marital Status: Married    Objective:  BP 102/68 (BP Location: Left Arm, Patient Position: Sitting, Cuff Size: Large)   Pulse 83   Temp 97.7 F (36.5 C) (Temporal)   Resp 16   Ht 5' 9 (1.753 m)   Wt 232 lb 6.4 oz (105.4 kg)   SpO2 96%   BMI 34.32 kg/m      05/20/2023   10:03 AM 04/19/2023   10:57 AM 02/10/2023    8:58 AM  BP/Weight  Systolic BP 102 120 128  Diastolic BP 68 70 82  Wt. (Lbs) 232.4  222  BMI 34.32 kg/m2  32.78 kg/m2    Physical Exam Constitutional:      General: He is not in acute distress.    Appearance: Normal appearance. He is not ill-appearing.  Eyes:     Conjunctiva/sclera: Conjunctivae normal.  Cardiovascular:     Rate and Rhythm: Normal rate and regular rhythm.     Heart sounds: Normal heart sounds. No murmur heard. Pulmonary:     Effort: Pulmonary effort is normal.     Breath sounds: Normal breath sounds. No wheezing.  Musculoskeletal:     Cervical back: Normal range of motion. No rigidity.     Right knee: Decreased range of motion.     Left knee: Normal.  Neurological:     Mental Status: He is alert. Mental status is at baseline.     Gait: Gait abnormal (due to right knee).  Psychiatric:        Mood and Affect: Mood normal.        Behavior: Behavior  normal.    Lab Results  Component Value Date   WBC 10.0 02/10/2023   HGB 14.3 02/10/2023   HCT 43.7 02/10/2023   PLT 151 02/10/2023   GLUCOSE 118 (H) 02/10/2023   CHOL 131 02/10/2023   TRIG 59 02/10/2023   HDL 42 02/10/2023   LDLCALC 76 02/10/2023   ALT 19 02/10/2023   AST 16 02/10/2023   NA 136 02/10/2023   K 5.0 02/10/2023   CL 101 02/10/2023   CREATININE 1.06 02/10/2023   BUN 10 02/10/2023   CO2 23 02/10/2023   TSH 3.650 02/10/2023   HGBA1C 6.3 (H) 02/10/2023   MICROALBUR 10 05/13/2021      Assessment & Plan:    Preop examination Assessment & Plan: It is my assessment that Zariah is a low risk for this low risk surgery based on the below risk assessment tools.  BP Readings from Last 3 Encounters:  05/20/23 102/68  04/19/23 120/70  02/10/23 128/82    Gupta Perioperative Risk for Myocardial Infarction or Cardiac Arrest (MICA) from [email or web address]  on 05/20/2023 ** All calculations should be rechecked by  clinician prior to use **  RESULT SUMMARY: 0.0 % Risk of myocardial infarction or cardiac arrest, intraoperatively or up to 30 days post-op   INPUTS: Age --> 57 years Functional status --> 0 = Independent ASA class --> -5.17 = 1: normal healthy patient Creatinine --> 0 = Normal (<=1.5 mg/dL, 866 mol/L)  Revised Cardiac Risk Index for Pre-Operative Risk from [email or web address]  on 05/20/2023 ** All calculations should be rechecked by clinician prior to use **  RESULT SUMMARY: 0 points RCRI Score  3.9 % Risk of major cardiac event   INPUTS: High-risk surgery --> 0 = No History of ischemic heart disease --> 0 = No History of congestive heart failure --> 0 = No History of cerebrovascular disease --> 0 = No Pre-operative treatment with insulin --> 0 = No Pre-operative creatinine >2 mg/dL / 823.1 mol/L --> 0 = No  Preoperative Mortality Predictor (PMP) Score from [email or web address]  on 05/20/2023 ** All calculations should be rechecked by clinician prior to use  **  RESULT SUMMARY: -1 points PMP Score  0.1 % Risk of perioperative mortality   INPUTS: Inpatient --> 0 = No Sepsis --> 0 = No Poor functional status --> 0 = No Disseminated cancer --> 0 = No Age, years --> 0 = <65 Cardiac comorbidity --> 0 = No Pulmonary comorbidity --> 0 = No Renal comorbidity --> 0 = No Liver comorbidity --> 0 = No Steroids for chronic condition --> 0 = No Weight loss --> 0 = No Bleeding disorder --> 0 = No DNR status --> 0 = No Obesity --> -1 = Yes    Type 2 diabetes mellitus with hyperglycemia, without long-term current use of insulin (HCC) Assessment & Plan: Well controlled - prediabetes The current medical regimen is effective;  continue present plan and medications.  Metformin  500 mg by mouth TWICE A DAY Last A1C - 6.4% A1C drawn today for upcoming surgery of the right knee.  Orders: -     Hemoglobin A1c     No orders of the defined types were placed in this encounter.   Orders Placed This Encounter  Procedures   Hemoglobin A1c     Follow-up: Return in about 3 months (around 08/18/2023) for chronic.   An After Visit Summary was printed and given to the patient.  Total time spent on today's visit was 38 minutes, including both face-to-face time and nonface-to-face time personally spent on review of chart (labs and imaging), discussing labs and goals, discussing further work-up, treatment options, referrals to specialist if needed, reviewing outside records if pertinent, answering patient's questions, and coordinating care.    Harrie Cedar, FNP [facility] [phone]

## 2023-05-20 NOTE — Assessment & Plan Note (Signed)
 Well controlled - prediabetes The current medical regimen is effective;  continue present plan and medications.  Metformin 500 mg by mouth TWICE A DAY Last A1C - 6.4% A1C drawn today for upcoming surgery of the right knee.

## 2023-05-21 LAB — HEMOGLOBIN A1C
Est. average glucose Bld gHb Est-mCnc: 131 mg/dL
Hgb A1c MFr Bld: 6.2 % — ABNORMAL HIGH (ref 4.8–5.6)

## 2023-05-25 NOTE — Telephone Encounter (Signed)
 Called the patient's surgeon's office specifically left a message for the surgery scheduler that sent over the paperwork stating that the patient is calling asking about needing a EKG and on the paperwork that was sent over stated that the EKG is only PRN if patient has a history of CABG, CAD, MI, STENT, or Arrhythmia and patient has none so that is the reasoning of no EKG being done in the surgical clearance. She did not answer so left message informing her because we did not get notification that one was needed.

## 2023-05-26 ENCOUNTER — Ambulatory Visit: Payer: 59

## 2023-05-27 NOTE — Telephone Encounter (Signed)
Called patient and spoke with him and the Surgeon's office did call him and tell him he did not need to have a EKG. Patient informed.

## 2023-07-22 ENCOUNTER — Other Ambulatory Visit: Payer: Self-pay | Admitting: Family Medicine

## 2023-07-22 DIAGNOSIS — F419 Anxiety disorder, unspecified: Secondary | ICD-10-CM

## 2023-07-27 ENCOUNTER — Other Ambulatory Visit: Payer: Self-pay | Admitting: Family Medicine

## 2023-07-27 DIAGNOSIS — E039 Hypothyroidism, unspecified: Secondary | ICD-10-CM

## 2023-08-11 ENCOUNTER — Ambulatory Visit: Payer: 59 | Admitting: Family Medicine

## 2023-09-13 ENCOUNTER — Other Ambulatory Visit: Payer: Self-pay | Admitting: Family Medicine

## 2023-09-13 DIAGNOSIS — E669 Obesity, unspecified: Secondary | ICD-10-CM

## 2023-10-18 ENCOUNTER — Ambulatory Visit: Payer: 59 | Admitting: Allergy

## 2023-10-19 ENCOUNTER — Other Ambulatory Visit: Payer: Self-pay

## 2023-10-19 DIAGNOSIS — E786 Lipoprotein deficiency: Secondary | ICD-10-CM

## 2023-10-19 DIAGNOSIS — E785 Hyperlipidemia, unspecified: Secondary | ICD-10-CM

## 2023-10-31 ENCOUNTER — Other Ambulatory Visit: Payer: Self-pay

## 2023-10-31 DIAGNOSIS — E786 Lipoprotein deficiency: Secondary | ICD-10-CM

## 2023-10-31 DIAGNOSIS — E785 Hyperlipidemia, unspecified: Secondary | ICD-10-CM

## 2023-10-31 MED ORDER — ROSUVASTATIN CALCIUM 40 MG PO TABS: ORAL_TABLET | ORAL | 0 refills | Status: DC

## 2024-01-12 ENCOUNTER — Other Ambulatory Visit: Payer: Self-pay | Admitting: Family Medicine

## 2024-01-12 DIAGNOSIS — F419 Anxiety disorder, unspecified: Secondary | ICD-10-CM

## 2024-01-25 ENCOUNTER — Other Ambulatory Visit: Payer: Self-pay | Admitting: Family Medicine

## 2024-01-25 DIAGNOSIS — E785 Hyperlipidemia, unspecified: Secondary | ICD-10-CM

## 2024-01-25 DIAGNOSIS — E786 Lipoprotein deficiency: Secondary | ICD-10-CM

## 2024-02-29 ENCOUNTER — Other Ambulatory Visit: Payer: Self-pay | Admitting: Family Medicine

## 2024-02-29 DIAGNOSIS — E119 Type 2 diabetes mellitus without complications: Secondary | ICD-10-CM

## 2024-04-14 ENCOUNTER — Other Ambulatory Visit: Payer: Self-pay | Admitting: Family Medicine

## 2024-04-14 DIAGNOSIS — E039 Hypothyroidism, unspecified: Secondary | ICD-10-CM
